# Patient Record
Sex: Male | Born: 1935 | Race: White | Hispanic: No | Marital: Married | Smoking: Former smoker
Health system: Southern US, Community
[De-identification: ages and names within clinical notes are randomized; demographics above are authoritative.]

## PROBLEM LIST (undated history)

## (undated) DIAGNOSIS — Z87898 Personal history of other specified conditions: Secondary | ICD-10-CM

## (undated) DIAGNOSIS — R972 Elevated prostate specific antigen [PSA]: Secondary | ICD-10-CM

## (undated) DIAGNOSIS — Z8781 Personal history of (healed) traumatic fracture: Secondary | ICD-10-CM

## (undated) DIAGNOSIS — M4850XA Collapsed vertebra, not elsewhere classified, site unspecified, initial encounter for fracture: Secondary | ICD-10-CM

## (undated) DIAGNOSIS — K635 Polyp of colon: Secondary | ICD-10-CM

## (undated) DIAGNOSIS — F0390 Unspecified dementia without behavioral disturbance: Secondary | ICD-10-CM

## (undated) DIAGNOSIS — I4891 Unspecified atrial fibrillation: Secondary | ICD-10-CM

## (undated) DIAGNOSIS — I10 Essential (primary) hypertension: Secondary | ICD-10-CM

## (undated) DIAGNOSIS — C44721 Squamous cell carcinoma of skin of unspecified lower limb, including hip: Secondary | ICD-10-CM

## (undated) DIAGNOSIS — H9190 Unspecified hearing loss, unspecified ear: Secondary | ICD-10-CM

## (undated) DIAGNOSIS — Z8673 Personal history of transient ischemic attack (TIA), and cerebral infarction without residual deficits: Secondary | ICD-10-CM

## (undated) HISTORY — DX: Unspecified hearing loss, unspecified ear: H91.90

## (undated) HISTORY — DX: Personal history of other specified conditions: Z87.898

## (undated) HISTORY — PX: TONSILLECTOMY: SHX5217

## (undated) HISTORY — DX: Unspecified atrial fibrillation: I48.91

## (undated) HISTORY — DX: Elevated prostate specific antigen (PSA): R97.20

## (undated) HISTORY — DX: Collapsed vertebra, not elsewhere classified, site unspecified, initial encounter for fracture: M48.50XA

## (undated) HISTORY — DX: Personal history of transient ischemic attack (TIA), and cerebral infarction without residual deficits: Z86.73

## (undated) HISTORY — DX: Squamous cell carcinoma of skin of unspecified lower limb, including hip: C44.721

## (undated) HISTORY — DX: Polyp of colon: K63.5

## (undated) HISTORY — PX: INGUINAL HERNIA REPAIR: SHX194

## (undated) HISTORY — DX: Unspecified dementia, unspecified severity, without behavioral disturbance, psychotic disturbance, mood disturbance, and anxiety: F03.90

## (undated) HISTORY — PX: MIDDLE EAR SURGERY: SHX713

## (undated) HISTORY — DX: Essential (primary) hypertension: I10

## (undated) HISTORY — DX: Personal history of (healed) traumatic fracture: Z87.81

---

## 2000-03-12 DIAGNOSIS — K635 Polyp of colon: Secondary | ICD-10-CM

## 2000-03-12 HISTORY — PX: COLONOSCOPY W/ POLYPECTOMY: SHX1380

## 2000-03-12 HISTORY — DX: Polyp of colon: K63.5

## 2004-03-12 DIAGNOSIS — Z8781 Personal history of (healed) traumatic fracture: Secondary | ICD-10-CM

## 2004-03-12 HISTORY — DX: Personal history of (healed) traumatic fracture: Z87.81

## 2008-03-12 DIAGNOSIS — C44721 Squamous cell carcinoma of skin of unspecified lower limb, including hip: Secondary | ICD-10-CM

## 2008-03-12 HISTORY — DX: Squamous cell carcinoma of skin of unspecified lower limb, including hip: C44.721

## 2010-03-12 DIAGNOSIS — R972 Elevated prostate specific antigen [PSA]: Secondary | ICD-10-CM

## 2010-03-12 HISTORY — DX: Elevated prostate specific antigen (PSA): R97.20

## 2012-01-25 ENCOUNTER — Ambulatory Visit (INDEPENDENT_AMBULATORY_CARE_PROVIDER_SITE_OTHER): Payer: Managed Care, Other (non HMO) | Admitting: Family Medicine

## 2012-01-25 ENCOUNTER — Encounter: Payer: Self-pay | Admitting: Family Medicine

## 2012-01-25 VITALS — BP 151/77 | HR 63 | Temp 98.0°F | Ht 66.0 in | Wt 125.0 lb

## 2012-01-25 DIAGNOSIS — F0390 Unspecified dementia without behavioral disturbance: Secondary | ICD-10-CM

## 2012-01-25 DIAGNOSIS — I10 Essential (primary) hypertension: Secondary | ICD-10-CM

## 2012-01-25 DIAGNOSIS — I499 Cardiac arrhythmia, unspecified: Secondary | ICD-10-CM

## 2012-01-25 DIAGNOSIS — Z7189 Other specified counseling: Secondary | ICD-10-CM

## 2012-01-25 DIAGNOSIS — Z7689 Persons encountering health services in other specified circumstances: Secondary | ICD-10-CM

## 2012-01-25 DIAGNOSIS — Z8673 Personal history of transient ischemic attack (TIA), and cerebral infarction without residual deficits: Secondary | ICD-10-CM

## 2012-01-25 DIAGNOSIS — I4891 Unspecified atrial fibrillation: Secondary | ICD-10-CM | POA: Insufficient documentation

## 2012-01-25 LAB — PROTIME-INR: INR: 2.55 — ABNORMAL HIGH (ref <1.50)

## 2012-01-25 NOTE — Assessment & Plan Note (Signed)
Mild + I'm sure his impaired hearing contributes to his problems with this. Seems to be tolerating aricept.  Will monitor response over time.

## 2012-01-25 NOTE — Assessment & Plan Note (Signed)
BP a bit up here today. Will continue current meds, check BMET today, obtain old records.

## 2012-01-25 NOTE — Assessment & Plan Note (Signed)
HR controlled well on digoxin. Will continue this and coumadin, check PT/INR today. Obtain old records for more details.

## 2012-01-25 NOTE — Assessment & Plan Note (Signed)
Reviewed history, will collect old records.

## 2012-01-25 NOTE — Progress Notes (Signed)
Office Note 01/25/2012  CC:  Chief Complaint  Patient presents with  . Establish Care    no problems    HPI:  Alexander Lyons is a 78 y.o. White male who is here with his son Peyton Najjar to establish care. Patient's most recent primary MD: Dr. Madelaine Etienne in [place], [place].  Patient lives in [facility]. Old records were not reviewed prior to or during today's visit.  He has no acute complaints today.  His son does not know many details of his medical history.  Unknown when last labs were done to check renal function or PT/INR. He and his wife recently moved from their home/farm to [facility] in [place], [place] and he says he is happy with this arrangement. Son describes hx of intermittent dizziness for which he sometimes takes meclizine. Also some intermittent c/o of upset stomach but no details known, and pt denies this today in the room. He is severely hearing impaired and refuses to wear hearing aids.    Past Medical History  Diagnosis Date  . HTN (hypertension)   . Dementia   . History of CVA (cerebrovascular accident)     no residual deficit (this apparently occured shortly after coming off of aspirin for cataract surgery  . Cardiac dysrhythmia   . Hearing impairment in [facility]    Bilateral; [facility] but he won't wear them    Past Surgical History  Procedure Date  . Middle ear surgery     ?external ear surgery, too.      No family history on file. Noncontributory  History   Social History  . Marital Status: Married    Spouse Name: N/A    Number of Children: N/A  . Years of Education: N/A   Occupational History  . Not on file.   Social History Main Topics  . Smoking status: Former Smoker    Types: Cigarettes    Quit date: 03/12/1968  . Smokeless tobacco: Never Used  . Alcohol Use: No  . Drug Use: No  . Sexually Active: Not on file   Other Topics Concern  . 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  No alcohol or drugs.Currently lives at [facility].  He was in the [employer] in [employer].    Outpatient Encounter Prescriptions as of 01/25/2012  Medication Sig Dispense Refill  . digoxin (LANOXIN) 0.125 MG tablet Take 1 tablet by mouth Daily.      Marland Kitchen donepezil (ARICEPT) 10 MG tablet Take 0.5 tablets by mouth At bedtime.      . hydrALAZINE (APRESOLINE) 25 MG tablet Take 1 tablet by mouth Twice daily as needed. For dizziness      . lisinopril-hydrochlorothiazide (PRINZIDE,ZESTORETIC) 20-25 MG per tablet Take 0.5 tablets by mouth Once daily.      Marland Kitchen warfarin (COUMADIN) 2.5 MG tablet Take 0.5 tablets by mouth At bedtime.        No Known Allergies  ROS Review of Systems  Constitutional: Negative for fever and fatigue.  HENT: Negative for congestion and sore throat.   Eyes: Negative for visual disturbance.  Respiratory: Negative for cough.   Cardiovascular: Negative for chest pain.  Gastrointestinal: Negative for nausea and abdominal pain.  Genitourinary: Negative for dysuria.  Musculoskeletal: Negative for back pain and joint swelling.  Skin: Negative for rash.  Neurological: Negative for weakness and headaches.  Hematological: Negative for adenopathy.  Psychiatric/Behavioral: Negative for dysphoric  mood.    PE; Blood pressure 151/77, pulse 63, temperature 98 F (36.7 C), temperature source Temporal, height 5\' 6"  (1.676 m), weight 125 lb (56.7 kg), SpO2 99.00%. Gen: Alert, well appearing, thin, elderly gentleman in NAD.  Patient is oriented to person, place, time, and situation. AFFECT: pleasant, lucid thought and speech. ENT: Ears: EACs clear, normal epithelium.  TMs with good light reflex and landmarks bilaterally.  Eyes: no injection, icteris, swelling, or exudate.  EOMI, PERRLA. Nose: no drainage or turbinate edema/swelling.  No injection or focal lesion.  Mouth: lips without lesion/swelling.  Oral mucosa pink and moist.  Dentition intact and  without obvious caries or gingival swelling.  Oropharynx without erythema, exudate, or swelling.  Neck - No masses or thyromegaly or limitation in range of motion CV: irregularly irregular rhythm, rate in the 60s-70.  No murmur, rub, or gallop. LUNGS: CTA bilat, nonlabored resps. ABD: soft, NT, ND, BS normal.  No hepatospenomegaly or mass.  No bruits. EXT: no clubbing, cyanosis, or edema.  MUSC: kyphotic spinal deformity  Pertinent labs:  None today  ASSESSMENT AND PLAN:   Encounter to establish care with new doctor Reviewed history, will collect old records.  Dementia Mild + I'm sure his impaired hearing contributes to his problems with this. Seems to be tolerating aricept.  Will monitor response over time.  HTN (hypertension) BP a bit up here today. Will continue current meds, check BMET today, obtain old records.  Cardiac dysrhythmia HR controlled well on digoxin. Will continue this and coumadin, check PT/INR today. Obtain old records for more details.   An After Visit Summary was printed and given to the patient.  Return in about 3 months (around 04/26/2012) for Office visit f/u HTN, dysrhythmia.  Lab visit in 1 mo for PT/INR.

## 2012-01-26 LAB — BASIC METABOLIC PANEL
CO2: 31 mEq/L (ref 19–32)
Chloride: 97 mEq/L (ref 96–112)
Creat: 1.28 mg/dL (ref 0.50–1.35)
Potassium: 4.3 mEq/L (ref 3.5–5.3)
Sodium: 137 mEq/L (ref 135–145)

## 2012-01-29 ENCOUNTER — Telehealth: Payer: Self-pay | Admitting: *Deleted

## 2012-01-29 NOTE — Telephone Encounter (Signed)
Coumadin dose corrected in med list per son, Peyton Najjar.

## 2012-02-22 ENCOUNTER — Other Ambulatory Visit (INDEPENDENT_AMBULATORY_CARE_PROVIDER_SITE_OTHER): Payer: Medicare Other

## 2012-02-22 DIAGNOSIS — I499 Cardiac arrhythmia, unspecified: Secondary | ICD-10-CM

## 2012-02-22 LAB — PROTIME-INR
INR: 2.2 ratio — ABNORMAL HIGH (ref 0.8–1.0)
Prothrombin Time: 22.9 s — ABNORMAL HIGH (ref 10.2–12.4)

## 2012-04-03 ENCOUNTER — Other Ambulatory Visit (INDEPENDENT_AMBULATORY_CARE_PROVIDER_SITE_OTHER): Payer: Managed Care, Other (non HMO)

## 2012-04-03 DIAGNOSIS — I499 Cardiac arrhythmia, unspecified: Secondary | ICD-10-CM

## 2012-04-03 LAB — PROTIME-INR
INR: 3.4 ratio — ABNORMAL HIGH (ref 0.8–1.0)
Prothrombin Time: 35.4 s — ABNORMAL HIGH (ref 10.2–12.4)

## 2012-04-03 NOTE — Progress Notes (Signed)
Labs only

## 2012-04-18 ENCOUNTER — Other Ambulatory Visit: Payer: Managed Care, Other (non HMO)

## 2012-04-18 ENCOUNTER — Other Ambulatory Visit (INDEPENDENT_AMBULATORY_CARE_PROVIDER_SITE_OTHER): Payer: Managed Care, Other (non HMO)

## 2012-04-18 DIAGNOSIS — I499 Cardiac arrhythmia, unspecified: Secondary | ICD-10-CM

## 2012-04-18 NOTE — Progress Notes (Signed)
Labs only

## 2012-04-25 ENCOUNTER — Ambulatory Visit: Payer: Managed Care, Other (non HMO) | Admitting: Family Medicine

## 2012-04-28 ENCOUNTER — Ambulatory Visit: Payer: Managed Care, Other (non HMO) | Admitting: Family Medicine

## 2012-05-02 ENCOUNTER — Ambulatory Visit (INDEPENDENT_AMBULATORY_CARE_PROVIDER_SITE_OTHER): Payer: Medicare Other | Admitting: Family Medicine

## 2012-05-02 ENCOUNTER — Encounter: Payer: Self-pay | Admitting: Family Medicine

## 2012-05-02 VITALS — BP 124/68 | HR 80 | Temp 97.2°F | Ht 66.0 in | Wt 123.0 lb

## 2012-05-02 DIAGNOSIS — R630 Anorexia: Secondary | ICD-10-CM

## 2012-05-02 DIAGNOSIS — I4891 Unspecified atrial fibrillation: Secondary | ICD-10-CM

## 2012-05-02 DIAGNOSIS — R350 Frequency of micturition: Secondary | ICD-10-CM

## 2012-05-02 LAB — CBC WITH DIFFERENTIAL/PLATELET
Basophils Relative: 0.3 % (ref 0.0–3.0)
Eosinophils Absolute: 0.1 10*3/uL (ref 0.0–0.7)
Eosinophils Relative: 0.6 % (ref 0.0–5.0)
HCT: 38.7 % — ABNORMAL LOW (ref 39.0–52.0)
Hemoglobin: 12.8 g/dL — ABNORMAL LOW (ref 13.0–17.0)
MCHC: 33.1 g/dL (ref 30.0–36.0)
MCV: 87 fl (ref 78.0–100.0)
Monocytes Absolute: 1.1 10*3/uL — ABNORMAL HIGH (ref 0.1–1.0)
Neutro Abs: 7.6 10*3/uL (ref 1.4–7.7)
RBC: 4.45 Mil/uL (ref 4.22–5.81)
WBC: 10.4 10*3/uL (ref 4.5–10.5)

## 2012-05-02 LAB — POCT URINALYSIS DIPSTICK
Blood, UA: NEGATIVE
Protein, UA: NEGATIVE
Spec Grav, UA: 1.025
pH, UA: 5.5

## 2012-05-02 LAB — COMPREHENSIVE METABOLIC PANEL
Albumin: 3.5 g/dL (ref 3.5–5.2)
Alkaline Phosphatase: 56 U/L (ref 39–117)
BUN: 19 mg/dL (ref 6–23)
CO2: 29 mEq/L (ref 19–32)
GFR: 58.87 mL/min — ABNORMAL LOW (ref >60.00)
Glucose, Bld: 99 mg/dL (ref 70–99)
Potassium: 3.8 mEq/L (ref 3.5–5.1)
Total Bilirubin: 0.8 mg/dL (ref 0.3–1.2)

## 2012-05-02 LAB — PSA: PSA: 136.31 ng/mL — ABNORMAL HIGH (ref 0.10–4.00)

## 2012-05-02 NOTE — Progress Notes (Signed)
OFFICE NOTE  05/02/2012  CC:  Chief Complaint  Patient presents with  . Follow-up    HTN, dysrhythmia     HPI: Patient is a 79 y.o. Caucasian male who is here for 3 mo f/u dementia, a-fib w/chronic coumadin therapy, and HTN. Son with him today and reports that pt's wife says pt has been c/o decreased energy, not eating well lately, increase in his chronic urinary frequency.   The patient himself denies any complaints today, specifically hematuria, dysuria, abd pain, or GU/anal pain.  No change in stool habits, no blood in stool.    Pertinent PMH:  Past Medical History  Diagnosis Date  . HTN (hypertension)   . Dementia   . History of CVA (cerebrovascular accident)     no residual deficit (this apparently occured shortly after coming off of aspirin for cataract surgery.  Of note, old records show MRI head w/out contrast 07/2010 showed chronic small vessel infarctions w/in the pons + chronic small vessel dz throughout the brain.  . Atrial fibrillation   . Hearing impairment in [facility] 2    Bilateral; [facility] but he won't wear them  . Colon polyp 2002  . History of dizziness   . Elevated PSA 2012    PSA 28.13 Apr 2010: Urology eval (Dr. Cleone Slim)  . SCC (squamous cell carcinoma), leg 2010    left  . History of rib fracture 2006    4 on right; nondisplaced (s/p lawn mower fell on him)  . Vertebral compression fracture     30% anterior wedge compression deformity T11   Past surgical, social, and family history reviewed and no changes noted since last office visit.  MEDS:  Outpatient Prescriptions Prior to Visit  Medication Sig Dispense Refill  . digoxin (LANOXIN) 0.125 MG tablet Take 1 tablet by mouth Daily.      Marland Kitchen donepezil (ARICEPT) 10 MG tablet Take 0.5 tablets by mouth At bedtime.      . hydrALAZINE (APRESOLINE) 25 MG tablet Take 1 tablet by mouth Twice daily as needed. For dizziness      . lisinopril-hydrochlorothiazide (PRINZIDE,ZESTORETIC) 20-25 MG per tablet Take 0.5  tablets by mouth Once daily.      Marland Kitchen warfarin (COUMADIN) 2.5 MG tablet Take 1/2 tab M, W, F, S, Su and 1 tab on Tues, Thurs       No facility-administered medications prior to visit.    PE: Blood pressure 124/68, pulse 80, temperature 97.2 F (36.2 C), temperature source Temporal, height 5\' 6"  (1.676 m), weight 123 lb (55.792 kg). Gen: Alert, well appearing.  Patient is oriented to person, place, time, and situation. Very much hearing impaired. ENT:   Eyes: no injection, icteris, swelling, or exudate.  EOMI, PERRLA. Nose: no drainage or turbinate edema/swelling.  No injection or focal lesion.  Mouth: lips without lesion/swelling.  Oral mucosa pink and moist. Oropharynx without erythema, exudate, or swelling.  CV: Irreg irreg rhythm, rate 80s, no m/r/g LUNGS: CTA bilat, nonlabored resps EXT: no clubbing, cyanosis, or edema.   CC UA today showed trace ketones, o/w normal.  IMPRESSION AND PLAN:  Atrial fibrillation Stable.  Rate controlled with dig, anticoagulated with coumadin. Due for PT/INR today.    HTN (hypertension) Problem stable.  Continue current medications and diet appropriate for this condition.  We have reviewed our general long term plan for this problem and also reviewed symptoms and signs that should prompt the patient to call or return to the office.  Dementia Stable. Continue aricept.  Urinary frequency With mild nonspecific malaise and diminished appetite per wife's report---no sign of urinary infection on UA today, but will send for c/s. He could have prostatitis, but will hold off on antibiotics until all blood tests come back (cbc, cmet, PT/INR, PSA, TSH). If all come back ok, will start empiric abx for prostatitis and see how he does, follow INR closely since the abx will potentially alter coumadin metabilism.  Elevated PSA Hx of elevated PSA and there is hint of a further w/u either by a urologist or hem/onc MD in the past but pt's son doesn't really have  any history regarding this except " I think they wanted to do a biopsy but my brother declined to let them do this b/c he felt like they would not pursue treatment if they found cancer".  Will try to get old records (Dr. Cleone Slim is mentioned in old records).   An After Visit Summary was printed and given to the patient.  FOLLOW UP: 49mo

## 2012-05-02 NOTE — Assessment & Plan Note (Signed)
With mild nonspecific malaise and diminished appetite per wife's report---no sign of urinary infection on UA today, but will send for c/s. He could have prostatitis, but will hold off on antibiotics until all blood tests come back (cbc, cmet, PT/INR, PSA, TSH). If all come back ok, will start empiric abx for prostatitis and see how he does, follow INR closely since the abx will potentially alter coumadin metabilism.

## 2012-05-02 NOTE — Assessment & Plan Note (Signed)
Problem stable.  Continue current medications and diet appropriate for this condition.  We have reviewed our general long term plan for this problem and also reviewed symptoms and signs that should prompt the patient to call or return to the office.  

## 2012-05-02 NOTE — Assessment & Plan Note (Signed)
Hx of elevated PSA and there is hint of a further w/u either by a urologist or hem/onc MD in the past but pt's son doesn't really have any history regarding this except " I think they wanted to do a biopsy but my brother declined to let them do this b/c he felt like they would not pursue treatment if they found cancer".  Will try to get old records (Dr. Cleone Slim is mentioned in old records).

## 2012-05-02 NOTE — Assessment & Plan Note (Signed)
Stable.  Rate controlled with dig, anticoagulated with coumadin. Due for PT/INR today.

## 2012-05-02 NOTE — Assessment & Plan Note (Signed)
Stable Continue aricept 

## 2012-05-05 ENCOUNTER — Other Ambulatory Visit: Payer: Self-pay | Admitting: Family Medicine

## 2012-05-05 MED ORDER — SULFAMETHOXAZOLE-TRIMETHOPRIM 800-160 MG PO TABS: 1.0000 | ORAL_TABLET | Freq: Two times a day (BID) | ORAL | Status: AC

## 2012-05-14 ENCOUNTER — Telehealth: Payer: Self-pay | Admitting: Family Medicine

## 2012-05-14 ENCOUNTER — Other Ambulatory Visit (INDEPENDENT_AMBULATORY_CARE_PROVIDER_SITE_OTHER): Payer: Medicare Other

## 2012-05-14 DIAGNOSIS — I4891 Unspecified atrial fibrillation: Secondary | ICD-10-CM

## 2012-05-14 LAB — PROTIME-INR: Prothrombin Time: 74.9 s (ref 10.2–12.4)

## 2012-05-14 NOTE — Telephone Encounter (Signed)
Caller: Alexander Lyons; PCP: [name]); CB#: [phone]; Call regarding Alexander Lyons is calling from [facility] regarding a PT/INR ordered on Alexander Lyons, Alexander Lyons by Earley Favor [facility]).  Same is Critical at 74.9 and 7.357.  Prior labs were drawn on 04/18/12 26.4 and 2.5.  Per standing orders, contacted the home.  Spoke with Alexander Lyons and she denies any bleeding or problems.  States that he would not be able to hear me or talk to me.  He is on Coumadin 2.5mg  tabs taking 1/2 tab everyday except for Monday and Thursday and he takes a whole tablet.  He was started on an antibiotic last week for a prostate infection: Trimetho/sulfamethox 160/800mg  1 po bid.   She will hold the Coumadin for tonight.  I am calling MD on call for further instructions.  He advised to send a note to [place], Charity fundraiser and if patient were to start bleeding that he will need to go to the ED.  I instruceted Alexander Lyons on this.  She will have to call 911 if he develops any bleeding and agreed to do so.  I assured her that Arline Asp would be calling her in the am.

## 2012-05-14 NOTE — Progress Notes (Signed)
Labs only

## 2012-05-15 ENCOUNTER — Telehealth: Payer: Self-pay | Admitting: *Deleted

## 2012-05-15 ENCOUNTER — Ambulatory Visit (INDEPENDENT_AMBULATORY_CARE_PROVIDER_SITE_OTHER): Payer: Medicare Other | Admitting: General Practice

## 2012-05-15 ENCOUNTER — Other Ambulatory Visit: Payer: Medicare Other

## 2012-05-15 ENCOUNTER — Telehealth: Payer: Self-pay | Admitting: General Practice

## 2012-05-15 DIAGNOSIS — R630 Anorexia: Secondary | ICD-10-CM

## 2012-05-15 DIAGNOSIS — I4891 Unspecified atrial fibrillation: Secondary | ICD-10-CM

## 2012-05-15 DIAGNOSIS — Z7901 Long term (current) use of anticoagulants: Secondary | ICD-10-CM

## 2012-05-15 DIAGNOSIS — F0390 Unspecified dementia without behavioral disturbance: Secondary | ICD-10-CM

## 2012-05-15 NOTE — Telephone Encounter (Signed)
OK, referral ordered 

## 2012-05-15 NOTE — Telephone Encounter (Signed)
Instructed pt  to stop coumadin and have pt/inr checked in lab at [facility] on Monday 3/10.  Spoke with Judeth Cornfield, Dr. Samul Dada CMA and asked her to call patient's son to schedule lab appointment.

## 2012-05-15 NOTE — Telephone Encounter (Signed)
Noted  

## 2012-05-15 NOTE — Telephone Encounter (Signed)
Staff message sent to [facility] to notify referral entered.

## 2012-05-15 NOTE — Patient Instructions (Addendum)
Opened in error

## 2012-05-15 NOTE — Telephone Encounter (Signed)
I will call Alexander Lyons.  We are in the process of setting up home health for [name] to have nursing services and PT/INR drawn at home. Will document follow up in result note.

## 2012-05-15 NOTE — Telephone Encounter (Signed)
Alexander Lyons is agreeable to home health nursing services to eval [name] and do PT/INR.  I spoke to [place] at [facility].  Pt will qualify with homebound status and diagnosis of dementia and decrease appetite. Order should state: "RN to assess all needs and add other disciplines as needed"; also PT/INR as directed to be called to physician I will send a staff message to [facility] when referral has been entered.

## 2012-05-20 ENCOUNTER — Telehealth: Payer: Self-pay | Admitting: *Deleted

## 2012-05-20 LAB — PROTIME-INR: Protime: 21.1 seconds

## 2012-05-20 NOTE — Telephone Encounter (Signed)
PC from [facility] RN PT=21.1 INR=1.8 Pt continues to hold coumadin.  Please advise.

## 2012-05-20 NOTE — Telephone Encounter (Signed)
Restart coumadin:  2.5mg  tab, 1/2 on M/T/Th/Sat/Sun, and 1 whole 2.5mg  tab on wed and Friday. Recheck PT/INR in 10d.  -thx

## 2012-05-20 NOTE — Telephone Encounter (Signed)
Alexander Lyons notified of instructions.  15 minutes spent discussing coumadin, dosing schedule, antibiotics and home health needs.  Alexander Lyons will give his mother instructions for coumadin.  [name] notified to recheck INR in ten days. Alexander Lyons also wanted to know if there is any follow up after finishing antibiotics.  Pt has two pills left.  Please advise.

## 2012-05-22 ENCOUNTER — Encounter (HOSPITAL_COMMUNITY): Payer: Self-pay

## 2012-05-22 ENCOUNTER — Telehealth: Payer: Self-pay | Admitting: *Deleted

## 2012-05-22 ENCOUNTER — Inpatient Hospital Stay (HOSPITAL_COMMUNITY)
Admission: EM | Admit: 2012-05-22 | Discharge: 2012-05-26 | DRG: 683 | Disposition: A | Discharge status: Skilled Nursing Facility | Payer: Medicare Other | Attending: Internal Medicine | Admitting: Internal Medicine

## 2012-05-22 DIAGNOSIS — IMO0002 Reserved for concepts with insufficient information to code with codable children: Secondary | ICD-10-CM

## 2012-05-22 DIAGNOSIS — I4891 Unspecified atrial fibrillation: Secondary | ICD-10-CM | POA: Diagnosis present

## 2012-05-22 DIAGNOSIS — Z7901 Long term (current) use of anticoagulants: Secondary | ICD-10-CM

## 2012-05-22 DIAGNOSIS — Z79899 Other long term (current) drug therapy: Secondary | ICD-10-CM

## 2012-05-22 DIAGNOSIS — N179 Acute kidney failure, unspecified: Principal | ICD-10-CM | POA: Diagnosis present

## 2012-05-22 DIAGNOSIS — R972 Elevated prostate specific antigen [PSA]: Secondary | ICD-10-CM | POA: Diagnosis present

## 2012-05-22 DIAGNOSIS — R627 Adult failure to thrive: Secondary | ICD-10-CM | POA: Diagnosis present

## 2012-05-22 DIAGNOSIS — I129 Hypertensive chronic kidney disease with stage 1 through stage 4 chronic kidney disease, or unspecified chronic kidney disease: Secondary | ICD-10-CM | POA: Diagnosis present

## 2012-05-22 DIAGNOSIS — E871 Hypo-osmolality and hyponatremia: Secondary | ICD-10-CM | POA: Diagnosis present

## 2012-05-22 DIAGNOSIS — Z860100 Personal history of colon polyps, unspecified: Secondary | ICD-10-CM

## 2012-05-22 DIAGNOSIS — F0390 Unspecified dementia without behavioral disturbance: Secondary | ICD-10-CM | POA: Diagnosis present

## 2012-05-22 DIAGNOSIS — N182 Chronic kidney disease, stage 2 (mild): Secondary | ICD-10-CM | POA: Diagnosis present

## 2012-05-22 DIAGNOSIS — Z87891 Personal history of nicotine dependence: Secondary | ICD-10-CM

## 2012-05-22 DIAGNOSIS — I959 Hypotension, unspecified: Secondary | ICD-10-CM | POA: Diagnosis present

## 2012-05-22 DIAGNOSIS — Z8673 Personal history of transient ischemic attack (TIA), and cerebral infarction without residual deficits: Secondary | ICD-10-CM

## 2012-05-22 LAB — BASIC METABOLIC PANEL
BUN: 50 mg/dL — ABNORMAL HIGH (ref 6–23)
Calcium: 9 mg/dL (ref 8.4–10.5)
Chloride: 91 mEq/L — ABNORMAL LOW (ref 96–112)
Creatinine, Ser: 2.93 mg/dL — ABNORMAL HIGH (ref 0.50–1.35)
GFR calc Af Amer: 20 mL/min — ABNORMAL LOW (ref >90)
GFR calc non Af Amer: 17 mL/min — ABNORMAL LOW (ref >90)

## 2012-05-22 LAB — URINALYSIS, ROUTINE W REFLEX MICROSCOPIC
Hgb urine dipstick: NEGATIVE
Ketones, ur: NEGATIVE mg/dL
Nitrite: NEGATIVE
Protein, ur: NEGATIVE mg/dL
Specific Gravity, Urine: 1.02 (ref 1.005–1.030)
Urobilinogen, UA: 1 mg/dL (ref 0.0–1.0)

## 2012-05-22 LAB — CBC WITH DIFFERENTIAL/PLATELET
Basophils Absolute: 0 10*3/uL (ref 0.0–0.1)
Eosinophils Absolute: 0.5 10*3/uL (ref 0.0–0.7)
Eosinophils Relative: 6 % — ABNORMAL HIGH (ref 0–5)
Lymphs Abs: 1.4 10*3/uL (ref 0.7–4.0)
MCH: 29.7 pg (ref 26.0–34.0)
MCV: 81.1 fL (ref 78.0–100.0)
Neutro Abs: 6.4 10*3/uL (ref 1.7–7.7)
Neutrophils Relative %: 63 % (ref 43–77)
Platelets: 366 10*3/uL (ref 150–400)
RBC: 4.91 MIL/uL (ref 4.22–5.81)
RDW: 14 % (ref 11.5–15.5)
WBC: 9 10*3/uL (ref 4.0–10.5)

## 2012-05-22 LAB — TSH: TSH: 2.654 u[IU]/mL (ref 0.350–4.500)

## 2012-05-22 LAB — PROTIME-INR
INR: 3.23 — ABNORMAL HIGH (ref 0.00–1.49)
INR: 3.4 — ABNORMAL HIGH (ref 0.00–1.49)

## 2012-05-22 MED ORDER — ONDANSETRON HCL 4 MG/2ML IJ SOLN: 4.0000 mg | Freq: Four times a day (QID) | INTRAMUSCULAR | Status: DC | PRN

## 2012-05-22 MED ORDER — SODIUM CHLORIDE 0.9 % IV SOLN
INTRAVENOUS | Status: DC
  Administered 2012-05-23 – 2012-05-25 (×2): via INTRAVENOUS
  Administered 2012-05-25: 10 mL/h via INTRAVENOUS

## 2012-05-22 MED ORDER — DIGOXIN 125 MCG PO TABS
0.1250 mg | ORAL_TABLET | Freq: Every day | ORAL | Status: DC
  Administered 2012-05-23: 0.125 mg via ORAL
  Filled 2012-05-22: qty 1

## 2012-05-22 MED ORDER — ONDANSETRON HCL 4 MG PO TABS: 4.0000 mg | ORAL_TABLET | Freq: Four times a day (QID) | ORAL | Status: DC | PRN

## 2012-05-22 MED ORDER — ALBUTEROL SULFATE (5 MG/ML) 0.5% IN NEBU: 2.5000 mg | INHALATION_SOLUTION | RESPIRATORY_TRACT | Status: DC | PRN

## 2012-05-22 MED ORDER — SODIUM CHLORIDE 0.9 % IV SOLN
INTRAVENOUS | Status: AC
  Administered 2012-05-22: 21:00:00 via INTRAVENOUS

## 2012-05-22 MED ORDER — DONEPEZIL HCL 5 MG PO TABS
5.0000 mg | ORAL_TABLET | Freq: Every day | ORAL | Status: DC
  Administered 2012-05-23 – 2012-05-25 (×3): 5 mg via ORAL
  Filled 2012-05-22 (×5): qty 1

## 2012-05-22 MED ORDER — ACETAMINOPHEN 650 MG RE SUPP: 650.0000 mg | Freq: Four times a day (QID) | RECTAL | Status: DC | PRN

## 2012-05-22 MED ORDER — ACETAMINOPHEN 325 MG PO TABS: 650.0000 mg | ORAL_TABLET | Freq: Four times a day (QID) | ORAL | Status: DC | PRN

## 2012-05-22 MED ORDER — SODIUM CHLORIDE 0.9 % IJ SOLN
3.0000 mL | Freq: Two times a day (BID) | INTRAMUSCULAR | Status: DC
  Administered 2012-05-24 – 2012-05-26 (×3): 3 mL via INTRAVENOUS

## 2012-05-22 MED ORDER — SODIUM CHLORIDE 0.9 % IV BOLUS (SEPSIS)
500.0000 mL | Freq: Once | INTRAVENOUS | Status: AC
  Administered 2012-05-22: 500 mL via INTRAVENOUS

## 2012-05-22 NOTE — Telephone Encounter (Signed)
Agree. Will call Mr. Haddon son later to discuss situation.

## 2012-05-22 NOTE — H&P (Signed)
[facility] History and Physical  [name]:[id number] DOB: 12/14/1916 DOA: 05/22/2012  Referring physician: EDP PCP: Jeoffrey Massed, MD  Specialists:  None  Chief Complaint: hypotension, generalized weakness.  HPI: Alexander Lyons is a 79 y.o. male with past medical history of hypertension, dementia, CVA, atrial fibrillation on Coumadin presents to ED on 05/22/12 with complaints of poor oral intake, generalized weakness and hypotension. Patient is unable to provide any history secondary to his dementia. History is obtained from patient's son Mr. Jahmir Salo who is at the bedside. Patient apparently is a generally poor eater. 2 weeks ago he was treated for possible prostatitis with oral antibiotics. Since then, apparently his appetite has gotten even worse. He barely eats and drinks water only with his medications. He has progressively become weak and has mostly been bedbound. No history of fever, chills, headache, earache, sore throat, cough, dyspnea, chest pain, vomiting, diarrhea, abdominal pain, urinary frequency or dysuria. Patient does feel cold at times and has occasional nausea. His INR shot up to greater than 7 approximately a week ago. When home health RN went to evaluate him, blood pressure was 88/50. He also had decreased urine output. His condition was discussed with his PCP who advised him to come to the ED for further evaluation. In the ED, initial blood pressure was 80/50 which improved after IV fluid bolus. Sodium 128 and creatinine 2.93 with baseline in the 1.2 range. Hospitalist service requested to admit for further evaluation and management.   Review of Systems: All systems reviewed and apart from history of presenting illness, are negative. Patient does have memory deficits from his dementia.   Past Medical History  Diagnosis Date  . HTN (hypertension)   . Dementia   . History of CVA (cerebrovascular accident)     no residual deficit (this apparently  occured shortly after coming off of aspirin for cataract surgery.  Of note, old records show MRI head w/out contrast 07/2010 showed chronic small vessel infarctions w/in the pons + chronic small vessel dz throughout the brain.  . Atrial fibrillation   . Hearing impairment in [facility] 2    Bilateral; [facility] but he won't wear them  . Colon polyp 2002  . History of dizziness   . Elevated PSA 2012    PSA 28.13 Apr 2010: Urology eval (Dr. Cleone Slim)  . SCC (squamous cell carcinoma), leg 2010    left  . History of rib fracture 2006    4 on right; nondisplaced (s/p lawn mower fell on him)  . Vertebral compression fracture     30% anterior wedge compression deformity T11   Past Surgical History  Procedure Laterality Date  . Middle ear surgery      Cholesteatoma-?external ear surgery, too.    . Inguinal hernia repair      right  . Colonoscopy w/ polypectomy  2002  . Tonsillectomy     Social History:  reports that he quit smoking about 44 years ago. His smoking use included Cigarettes. He smoked 0.00 packs per day. He has never used smokeless tobacco. He reports that he does not drink alcohol or use illicit drugs.  patient is married and lives with his wife. Usually he is independent of activities of daily living but for the last couple of weeks has been mostly bedbound.  No Known Allergies  History reviewed. No pertinent family history. reviewed with son and said to be negative.  Prior to Admission medications   Medication Sig  Start Date End Date Taking? Authorizing Provider  digoxin (LANOXIN) 0.125 MG tablet Take 0.125 mg by mouth daily.  01/22/12  Yes Historical Provider, MD  donepezil (ARICEPT) 10 MG tablet Take 5 mg by mouth at bedtime.  01/23/12  Yes Historical Provider, MD  hydrALAZINE (APRESOLINE) 25 MG tablet Take 1 tablet by mouth 2 (two) times daily as needed. For dizziness 01/22/12  Yes Historical Provider, MD  lisinopril-hydrochlorothiazide (PRINZIDE,ZESTORETIC) 20-25 MG per  tablet Take 0.5 tablets by mouth daily.  01/22/12  Yes Historical Provider, MD  warfarin (COUMADIN) 2.5 MG tablet Take 1.25-2.5 mg by mouth daily. Half tablet (1.25mg ) Monday, Tuesday, Thursday, Saturday, Sunday; 1 tablet (2.5mg ) Wednesday, Friday 01/22/12  Yes Historical Provider, MD   Physical Exam: Filed Vitals:   05/22/12 1930 05/22/12 2000 05/22/12 2015 05/22/12 2045  BP: 120/63 121/72  114/56  Pulse: 65 92 88 61  Temp:      TempSrc:      Resp: 12 12 23 17   SpO2: 100% 98% 99% 99%     General exam: Moderately built and thinly nourished male patient, lying comfortably supine on the gurney in no obvious distress.  Head, eyes and ENT: Nontraumatic and normocephalic. Pupils equally reacting to light and accommodation. Oral mucosa dry and tongue is coated white.  Neck: Supple. No JVD, carotid bruit or thyromegaly.  Lymphatics: No lymphadenopathy.  Respiratory system: Clear to auscultation. No increased work of breathing.  Cardiovascular system: S1 and S2 heard, irregularly irregular. No JVD, murmurs, gallops, clicks or pedal edema. Telemetry shows A. fib with controlled ventricular rate.  Gastrointestinal system: Abdomen is nondistended, soft and nontender. Normal bowel sounds heard. No organomegaly or masses appreciated.  Central nervous system: Alert and oriented only to self. No focal neurological deficits.  Extremities: Symmetric 5 x 5 power. Peripheral pulses symmetrically felt. Patchy redness bilateral legs anteriorly without pruritus or any other acute findings  Skin: No rashes or acute findings. Skin is dry.  Musculoskeletal system: Negative exam.  Psychiatry: Pleasant and cooperative.   Labs on Admission:  Basic Metabolic Panel:  Recent Labs Lab 05/22/12 1839  NA 128*  K 4.3  CL 91*  CO2 25  GLUCOSE 107*  BUN 50*  CREATININE 2.93*  CALCIUM 9.0   Liver Function Tests: No results found for this basename: AST, ALT, ALKPHOS, BILITOT, PROT, ALBUMIN,  in the  last 168 hours No results found for this basename: LIPASE, AMYLASE,  in the last 168 hours No results found for this basename: AMMONIA,  in the last 168 hours CBC:  Recent Labs Lab 05/22/12 1839  WBC 9.0  NEUTROABS 6.4  HGB 14.6  HCT 39.8  MCV 81.1  PLT 366   Cardiac Enzymes: No results found for this basename: CKTOTAL, CKMB, CKMBINDEX, TROPONINI,  in the last 168 hours  BNP (last 3 results) No results found for this basename: PROBNP,  in the last 8760 hours CBG: No results found for this basename: GLUCAP,  in the last 168 hours  Radiological Exams on Admission: No results found.  EKG: Independently reviewed. Atrial fibrillation with controlled ventricular rate, normal axis, ST depression and T-wave inversion in inferior and lateral leads and possible LVH.  Assessment/Plan Principal Problem:   Renal failure (ARF), acute on chronic Active Problems:   HTN (hypertension)   Dementia   Elevated PSA   Hypotension   Dehydration with hyponatremia   A-fib on Coumadin   1. Acute on stage II chronic kidney disease: Secondary to dehydration, hypotension/? ATN and ACE  inhibitors. Admit to telemetry. Hold ACE inhibitors. Check renal ultrasound. Hydrate with IV fluids and follow daily BMP. 2. Hyponatremic dehydration: Secondary to poor oral intake and diuretics. IV fluids and monitor. 3. Hypotension: Secondary to dehydration and antihypertensives. Improved after IV fluid bolus. Hold antihypertensives and continue IV fluids and monitor. 4. Atrial fibrillation on chronic Coumadin: Controlled ventricular rate. Recently coagulopathic secondary to poor oral intake and antibiotics. INR has improved but still slightly higher than intended goal. Digoxin level is therapeutic. Continue digoxin and Coumadin per pharmacy. 5. Dementia, moderate: No agitation. Fall precautions and bed alarms. 6. Failure to thrive: Secondary to acute medical illness complicating underlying dementia. Physical therapy  evaluation. 7. Elevated PSA: Outpatient followup with PCP on discharge.     Code Status: Full  Family Communication: Discussed with patient's son Mr. Destry Dauber at bedside.  Disposition Plan: Home when medically stable.   Time spent: 65 minutes  [facility] Pager [phone]  If 7PM-7AM, please contact night-coverage [email or web address] Password [email or web address] 05/22/2012, 9:22 PM

## 2012-05-22 NOTE — Telephone Encounter (Signed)
PC from [place], Charity fundraiser at [facility].  She is at the patient's home now. BP 88/50, P 95 Temp=97.3 Skin color poor, denies pain, alert, not oriented, lungs clear, still in a fib.  Pt has had 8 oz of water today, has urinated once today.  Pt took AM meds.  Verbal from Dr. Milinda Cave, pt needs to go to ED for fluids.  Thayer Ohm notified and she will relay to family.

## 2012-05-22 NOTE — Progress Notes (Signed)
ANTICOAGULATION CONSULT NOTE - Initial Consult  Pharmacy Consult for Coumadin Indication: atrial fibrillation  No Known Allergies  Labs:  Recent Labs  05/20/12 1200 05/22/12 1839  HGB  --  14.6  HCT  --  39.8  PLT  --  366  LABPROT  --  31.2*  INR 1.8* 3.23*  CREATININE  --  2.93*    The CrCl is unknown because both a height and weight (above a minimum accepted value) are required for this calculation.   Medical History: Past Medical History  Diagnosis Date  . HTN (hypertension)   . Dementia   . History of CVA (cerebrovascular accident)     no residual deficit (this apparently occured shortly after coming off of aspirin for cataract surgery.  Of note, old records show MRI head w/out contrast 07/2010 showed chronic small vessel infarctions w/in the pons + chronic small vessel dz throughout the brain.  . Atrial fibrillation   . [facility] in [facility]    Bilateral; [facility] but [facility] won't wear them  . Colon polyp 2002  . History of dizziness   . Elevated PSA 2012    PSA 28.13 Apr 2010: Urology eval (Dr. Cleone Slim)  . SCC (squamous cell carcinoma), leg 2010    left  . History of rib fracture 2006    4 on right; nondisplaced (s/p lawn mower fell on him)  . Vertebral compression fracture     30% anterior wedge compression deformity T11    Assessment: 79 year old male on Coumadin PTA for Afib.  Admitted with dehydration and not eating.  Admit INR is slightly elevated at 3.23  Dose PTA = 1.25 mg MTThSat, 2.5 mg WF  Goal of Therapy:  INR 2-3 Monitor platelets by anticoagulation protocol: Yes   Plan:  1) No Coumadin today 2) Daily INR  Thank you. Okey Regal, PharmD [phone]  Elwin Sleight 05/22/2012,9:28 PM

## 2012-05-22 NOTE — ED Notes (Signed)
Per EMS pt from home, [facility] RN called 911 d/t dehydration, pt is not eating or drinking 24-48 hrs, pt's BP 80/50 initially after receiving 100 cc NS pt's SBP increased to 104, 20 g RH, 3L [place]

## 2012-05-22 NOTE — Progress Notes (Signed)
Pt is confused and has pulled out his iv and pulled his tele box off.

## 2012-05-22 NOTE — ED Provider Notes (Signed)
History     CSN: [id number]  Arrival date & time 05/22/12  1804   First MD Initiated Contact with Patient 05/22/12 1818      Chief Complaint  Patient presents with  . Dehydration    (Consider location/radiation/quality/duration/timing/severity/associated sxs/prior treatment) Patient is a 79 y.o. male presenting with general illness.  Illness  Episode onset: chronic, but worsening for past 3 weeks. The onset was gradual. The problem occurs continuously. The problem has been gradually worsening. The problem is severe. Nothing relieves the symptoms. Nothing aggravates the symptoms. Pertinent negatives include no fever, no abdominal pain, no diarrhea, no nausea, no vomiting, no congestion and no cough. Associated symptoms comments: Drastically decreased PO intake.  Generalized weakness.    Past Medical History  Diagnosis Date  . HTN (hypertension)   . Dementia   . History of CVA (cerebrovascular accident)     no residual deficit (this apparently occured shortly after coming off of aspirin for cataract surgery.  Of note, old records show MRI head w/out contrast 07/2010 showed chronic small vessel infarctions w/in the pons + chronic small vessel dz throughout the brain.  . Atrial fibrillation   . Hearing impairment in [facility] 2    Bilateral; [facility] but [facility] won't wear them  . Colon polyp 2002  . History of dizziness   . Elevated PSA 2012    PSA 28.13 Apr 2010: Urology eval (Dr. Cleone Slim)  . SCC (squamous cell carcinoma), leg 2010    left  . History of rib fracture 2006    4 on right; nondisplaced (s/p lawn mower fell on him)  . Vertebral compression fracture     30% anterior wedge compression deformity T11    Past Surgical History  Procedure Laterality Date  . Middle ear surgery      Cholesteatoma-?external ear surgery, too.    . Inguinal hernia repair      right  . Colonoscopy w/ polypectomy  2002  . Tonsillectomy      History reviewed. No pertinent family  history.  History  Substance Use Topics  . Smoking status: Former Smoker    Types: Cigarettes    Quit date: 03/12/1968  . Smokeless tobacco: Never Used  . Alcohol Use: No      Review of Systems  Constitutional: Negative for fever.  HENT: Negative for congestion, facial swelling and trouble swallowing.   Respiratory: Negative for cough and shortness of breath.   Cardiovascular: Negative for chest pain.  Gastrointestinal: Negative for nausea, vomiting, abdominal pain and diarrhea.  Genitourinary: Negative for difficulty urinating.  All other systems reviewed and are negative.    Allergies  Review of patient's allergies indicates no known allergies.  Home Medications   Current Outpatient Rx  Name  Route  Sig  Dispense  Refill  . digoxin (LANOXIN) 0.125 MG tablet   Oral   Take 1 tablet by mouth Daily.         Marland Kitchen donepezil (ARICEPT) 10 MG tablet   Oral   Take 0.5 tablets by mouth At bedtime.         . hydrALAZINE (APRESOLINE) 25 MG tablet   Oral   Take 1 tablet by mouth Twice daily as needed. For dizziness         . lisinopril-hydrochlorothiazide (PRINZIDE,ZESTORETIC) 20-25 MG per tablet   Oral   Take 0.5 tablets by mouth Once daily.         Marland Kitchen warfarin (COUMADIN) 2.5 MG tablet  Take 1/2 tab M, W, F, S, Su and 1 tab on Tues, Thurs           BP 112/77  Temp(Src) 98.2 F (36.8 C) (Oral)  Resp 14  SpO2 98%  Physical Exam  Nursing note and vitals reviewed. Constitutional: He is oriented to person, place, and time. He appears well-developed. No distress.  Thin, elderly.  Generalized weakness.  HENT:  Head: Normocephalic and atraumatic.  Mouth/Throat: Oropharynx is clear and moist.  Eyes: Conjunctivae are normal. Pupils are equal, round, and reactive to light. No scleral icterus.  Neck: Normal range of motion. Neck supple.  Cardiovascular: Normal rate, regular rhythm, normal heart sounds and intact distal pulses.   No murmur  heard. Pulmonary/Chest: Effort normal and breath sounds normal. No stridor. No respiratory distress. He has no wheezes. He has no rales.  Abdominal: Soft. He exhibits no distension. There is no tenderness. There is no rebound and no guarding.  Musculoskeletal: Normal range of motion. He exhibits no edema.  Neurological: He is alert and oriented to person, place, and time.  Skin: Skin is warm and dry. No rash noted.  Psychiatric: He has a normal mood and affect. His behavior is normal.    ED Course  Procedures (including critical care time)   Date: 05/23/2012  Rate: 67  Rhythm: atrial fibrillation  QRS Axis: normal  Intervals: normal  ST/T Wave abnormalities: nonspecific ST/T changes  Conduction Disutrbances:none  Narrative Interpretation:   Old EKG Reviewed: none available    Labs Reviewed  CBC WITH DIFFERENTIAL - Abnormal; Notable for the following:    MCHC 36.7 (*)    Eosinophils Relative 6 (*)    All other components within normal limits  BASIC METABOLIC PANEL - Abnormal; Notable for the following:    Sodium 128 (*)    Chloride 91 (*)    Glucose, Bld 107 (*)    BUN 50 (*)    Creatinine, Ser 2.93 (*)    GFR calc non Af Amer 17 (*)    GFR calc Af Amer 20 (*)    All other components within normal limits  URINALYSIS, ROUTINE W REFLEX MICROSCOPIC - Abnormal; Notable for the following:    Color, Urine AMBER (*)    APPearance CLOUDY (*)    Bilirubin Urine SMALL (*)    All other components within normal limits  PROTIME-INR - Abnormal; Notable for the following:    Prothrombin Time 31.2 (*)    INR 3.23 (*)    All other components within normal limits  PROTIME-INR - Abnormal; Notable for the following:    Prothrombin Time 32.4 (*)    INR 3.40 (*)    All other components within normal limits  TSH  DIGOXIN LEVEL  BASIC METABOLIC PANEL  CBC   No results found.  Clinical Impression: Failure to Thrive, Acute Renal Insufficiency  MDM  79 yo male with generalized  weakness and decreased PO intake for past few weeks.  He has been unable to ambulate due to weakness for past 2-3 weeks.  Home health came to house today and saw BP of 80/50.  On initial exam, BPs normal, HR in 70's.  Nontoxic, but frail and thin.  Son says he has lost 10 lbs in past few months.  Denies other symptoms.  Labwork pending.  Will likely need admit for observation, PT, and further workup of failure to thrive.    Labwork showed acute renal insufficiency.  Consulted internal medicine who has admitted.  Rennis Petty, MD 05/23/12 [phone]

## 2012-05-22 NOTE — ED Notes (Signed)
Admitting physician at bedside

## 2012-05-23 ENCOUNTER — Inpatient Hospital Stay (HOSPITAL_COMMUNITY): Payer: Medicare Other

## 2012-05-23 DIAGNOSIS — N289 Disorder of kidney and ureter, unspecified: Secondary | ICD-10-CM

## 2012-05-23 LAB — BASIC METABOLIC PANEL
BUN: 42 mg/dL — ABNORMAL HIGH (ref 6–23)
CO2: 24 mEq/L (ref 19–32)
Chloride: 94 mEq/L — ABNORMAL LOW (ref 96–112)
GFR calc Af Amer: 26 mL/min — ABNORMAL LOW (ref >90)
Glucose, Bld: 79 mg/dL (ref 70–99)
Potassium: 3.7 mEq/L (ref 3.5–5.1)

## 2012-05-23 LAB — CBC
HCT: 36.6 % — ABNORMAL LOW (ref 39.0–52.0)
Hemoglobin: 12.8 g/dL — ABNORMAL LOW (ref 13.0–17.0)
MCH: 28.1 pg (ref 26.0–34.0)
MCHC: 35 g/dL (ref 30.0–36.0)

## 2012-05-23 MED ORDER — MEGESTROL ACETATE 400 MG/10ML PO SUSP
200.0000 mg | Freq: Every day | ORAL | Status: DC
  Administered 2012-05-23 – 2012-05-26 (×4): 200 mg via ORAL
  Filled 2012-05-23 (×4): qty 5

## 2012-05-23 MED ORDER — ENSURE COMPLETE PO LIQD
237.0000 mL | Freq: Three times a day (TID) | ORAL | Status: DC
  Administered 2012-05-23 – 2012-05-26 (×9): 237 mL via ORAL

## 2012-05-23 NOTE — Progress Notes (Signed)
INITIAL NUTRITION ASSESSMENT  DOCUMENTATION CODES Per approved criteria  -Severe malnutrition in the context of chronic illness -Underweight   INTERVENTION:  Continue Ensure Complete TID which provides 1050 kcal and 39 grams of protein  Magic Cup BID which will provide 580 kcal and 18 grams of protein  Recommend adding an appetite stimulant  NUTRITION DIAGNOSIS: Inadequate oral intake related to decreased appetite as evidenced by Alexander Lyons and son's report.   Goal: Intake to meet >/=90% estimated nutrition needs  Monitor:  Acceptance of Ensure Complete; acceptance of Magic Cup; PO's, weight trends; I/O's  Reason for Assessment: Malnutrition Screening Tool  79 y.o. male  Admitting Dx: Renal failure (ARF), acute on chronic  ASSESSMENT: Alexander Lyons dehydrated upon admission and with reports of not eating or drinking in the 24-28 hours PTA. This is a chronic condition for the Alexander Lyons and has been gradually worsening. He has been unable to ambulate for 2-3 weeks due to generalized weakness. Alexander Lyons with acute on chronic renal failure, Stage II CKD.  Alexander Lyons with hx of dementia and hearing loss. Limited information able to be obtained from Alexander Lyons due to dementia. No family present in the room at this time. Alexander Lyons currently on a Regular diet and ate 60% of breakfast this morning. Lunch tray in the room at time of visit, about 25% completion. Alexander Lyons has been ordered Ensure Complete TID. Recommend adding an appetite stimulant. Will order Magic Cup BID.   Alexander Lyons meets criteria for severe malnutrition in the context of chronic disease based on severe muscle and fat wasting and 7 pound weight loss (6% body weight) in one month.  Nutrition Focused Physical Exam:  Subcutaneous Fat:  Orbital Region: n/a Upper Arm Region: severe wasting Thoracic and Lumbar Region: n/a  Muscle:  Temple Region: severe wasting Clavicle Bone Region: severe wasting Clavicle and Acromion Bone Region: severe wasting  Edema: none  present    Height: Ht Readings from Last 1 Encounters:  05/22/12 6' (1.829 m)    Weight: Wt Readings from Last 1 Encounters:  05/23/12 116 lb 10 oz (52.9 kg)    Ideal Body Weight: 178 lbs   % Ideal Body Weight: 65%  Wt Readings from Last 10 Encounters:  05/23/12 116 lb 10 oz (52.9 kg)  05/02/12 123 lb (55.792 kg)  01/25/12 125 lb (56.7 kg)    Usual Body Weight: 125 lbs  % Usual Body Weight: 93%  BMI:  Body mass index is 15.81 kg/(m^2).-Underweight  Estimated Nutritional Needs: Kcal: 1600-1800 Protein: 80-90 grams Fluid: 1.6-1.8 L/day  Skin: intact, no wounds noted  Diet Order: General  EDUCATION NEEDS: -No education needs identified at this time   Intake/Output Summary (Last 24 hours) at 05/23/12 1435 Last data filed at 05/23/12 0900  Gross per 24 hour  Intake 1884.58 ml  Output      0 ml  Net 1884.58 ml    Last BM: PTA   Labs:   Recent Labs Lab 05/22/12 1839 05/23/12 0555  NA 128* 128*  K 4.3 3.7  CL 91* 94*  CO2 25 24  BUN 50* 42*  CREATININE 2.93* 2.33*  CALCIUM 9.0 8.5  GLUCOSE 107* 79    CBG (last 3)  No results found for this basename: GLUCAP,  in the last 72 hours  Scheduled Meds: . digoxin  0.125 mg Oral Daily  . donepezil  5 mg Oral QHS  . feeding supplement  237 mL Oral TID WC  . sodium chloride  3 mL Intravenous Q12H  Continuous Infusions: . sodium chloride 100 mL/hr at 05/23/12 5784    Past Medical History  Diagnosis Date  . HTN (hypertension)   . Dementia   . History of CVA (cerebrovascular accident)     no residual deficit (this apparently occured shortly after coming off of aspirin for cataract surgery.  Of note, old records show MRI head w/out contrast 07/2010 showed chronic small vessel infarctions w/in the pons + chronic small vessel dz throughout the brain.  . Atrial fibrillation   . Hearing impairment in [facility]    Bilateral; [facility] but [facility] won't wear them  . Colon polyp 2002  . History of  dizziness   . Elevated PSA 2012    PSA 28.13 Apr 2010: Urology eval (Dr. Cleone Slim)  . SCC (squamous cell carcinoma), leg 2010    left  . History of rib fracture 2006    4 on right; nondisplaced (s/p lawn mower fell on him)  . Vertebral compression fracture     30% anterior wedge compression deformity T11    Past Surgical History  Procedure Laterality Date  . Middle ear surgery      Cholesteatoma-?external ear surgery, too.    . Inguinal hernia repair      right  . Colonoscopy w/ polypectomy  2002  . Tonsillectomy      Trenton Gammon Dietetic Intern # [phone]  Maureen Chatters, RD, LDN Pager #: [phone] After-Hours Pager #: [phone]

## 2012-05-23 NOTE — ED Provider Notes (Signed)
I saw and evaluated the patient, reviewed the resident's note and I agree with the findings and plan. Pt with generalized weakness, decreased po intake. Appears dehydrated. Renal insuff on labs. Ivf, admit.   Suzi Roots, MD 05/23/12 [phone]

## 2012-05-23 NOTE — Clinical Documentation Improvement (Signed)
MALNUTRITION DOCUMENTATION CLARIFICATION  THIS DOCUMENT IS NOT A PERMANENT PART OF THE MEDICAL RECORD  TO RESPOND TO THE THIS QUERY, FOLLOW THE INSTRUCTIONS BELOW:  1. If needed, update documentation for the patient's encounter via the notes activity.  2. Access this query again and click edit on the In [employer].  3. After updating, or not, click F2 to complete all highlighted (required) fields concerning your review. Select "additional documentation in the medical record" OR "no additional documentation provided".  4. Click Sign note button.  5. The deficiency will fall out of your In Basket *Please let us know if you are not able to complete this workflow by phone or e-mail (listed below).  Please update your documentation within the medical record to reflect your response to this query.                                                                                        05/23/12   Dear Dr. Jerral Ralph / Associates,  In a better effort to capture your patient's severity of illness, reflect appropriate length of stay and utilization of resources, a review of the patient medical record has revealed the following indicators.    Based on your clinical judgment, please clarify and document in a progress note and/or discharge summary the clinical condition associated with the following supporting information:  In responding to this query please exercise your independent judgment.  The fact that a query is asked, does not imply that any particular answer is desired or expected.   Possible Clinical Conditions?  _____Severe Malnutrition    _____Severe Protein Calorie Malnutrition  _____Other Condition  _____Cannot clinically determine     Supporting Information: Risk Factors: Frail, thin, 10 pound weight loss, FTT secondary to acute medical illness complicating underlying dementia noted per 3/13 progress notes.  Signs & Symptoms: Ht: 6'     Wt: 114lb  BMI:  15.6   You may  use possible, probable, or suspect with inpatient documentation. possible, probable, suspected diagnoses MUST be documented at the time of discharge  Reviewed: No additional documentation provided.  Thank You,  Marciano Sequin,  Clinical Documentation Specialist:  Pager: [phone]  Phone: [phone]  Health Information Management [place]

## 2012-05-23 NOTE — Care Management Note (Signed)
    Page 1 of 1   05/26/2012     2:34:30 PM   CARE MANAGEMENT NOTE 05/26/2012  Patient:  Alexander Lyons, Alexander Lyons   Account Number:  [id number]  Date Initiated:  05/23/2012  Documentation initiated by:  Letha Cape  Subjective/Objective Assessment:   dx renal failure  admit-= lives with spouse.     Action/Plan:   pt eval- rec snf.   Anticipated DC Date:  05/26/2012   Anticipated DC Plan:  SKILLED NURSING FACILITY  In-house referral  Clinical Social Worker      DC Planning Services  CM consult      Choice offered to / List presented to:             Status of service:  Completed, signed off Medicare Important Message given?   (If response is "NO", the following Medicare IM given date fields will be blank) Date Medicare IM given:   Date Additional Medicare IM given:    Discharge Disposition:  SKILLED NURSING FACILITY  Per UR Regulation:  Reviewed for med. necessity/level of care/duration of stay  If discussed at Long Length of Stay Meetings, dates discussed:    Comments:  05/26/12 14:33 Letha Cape RN,BSN [phone] pt dc to [facility], CSW following.  05/23/12 16:43 Letha Cape RN, BSN [phone] patient lives with spouse, patient had a HHRN prior to hospitalization.  Per physical therapy recs snf, CSW referral.

## 2012-05-23 NOTE — Evaluation (Signed)
Physical Therapy Evaluation Patient Details Name: Alexander Lyons MRN: [id number] DOB: [birth year] Today's Date: 05/23/2012 Time: 4098-1191 PT Time Calculation (min): 15 min  PT Assessment / Plan / Recommendation Clinical Impression  Pt adm with acute renal failure, dehydration and weakness.  Pt currently requiring assis for mobility.  Needs skilled PT to maximize I and safety so pt can eventually return to his apt with his wife.  Recommend PT go to [facility] prior to return home due to decr mobility and decr cognition.    PT Assessment  Patient needs continued PT services    Follow Up Recommendations  SNF    Does the patient have the potential to tolerate intense rehabilitation      Barriers to Discharge Decreased caregiver support wife is 77 years old.    Equipment Recommendations  None recommended by PT    Recommendations for Other Services     Frequency Min 3X/week    Precautions / Restrictions Precautions Precautions: Fall   Pertinent Vitals/Pain N/A      Mobility  Bed Mobility Bed Mobility: Supine to Sit;Sitting - Scoot to [place] of Bed;Sit to Supine Supine to Sit: 4: Min assist Sitting - Scoot to Edge of Bed: 3: Mod assist Sit to Supine: 4: Min assist Details for Bed Mobility Assistance: Assist to bring trunk up and hips to EOB.  Assist to bring legs back up into bed. Transfers Transfers: Sit to Stand;Stand to Sit Sit to Stand: 4: Min assist;With upper extremity assist;From bed Stand to Sit: 4: Min assist;With upper extremity assist;To bed Details for Transfer Assistance: Assist to bring hips up and extend trunk, hips, and knees. Ambulation/Gait Ambulation/Gait Assistance: 4: Min assist Ambulation Distance (Feet): 150 Feet Assistive device: 1 person hand held assist Ambulation/Gait Assistance Details: Verbal cues to stand more erect. Gait Pattern: Step-through pattern;Decreased stride length;Narrow base of support;Trunk flexed Gait velocity: decr    Exercises      PT Diagnosis: Difficulty walking;Generalized weakness  PT Problem List: Decreased strength;Decreased activity tolerance;Decreased balance;Decreased mobility;Decreased knowledge of precautions;Decreased knowledge of use of DME;Decreased safety awareness PT Treatment Interventions: DME instruction;Gait training;Patient/family education;Functional mobility training;Therapeutic activities;Therapeutic exercise;Balance training   PT Goals Acute Rehab PT Goals PT Goal Formulation: Patient unable to participate in goal setting Time For Goal Achievement: 05/30/12 Potential to Achieve Goals: Good Pt will go Supine/Side to Sit: with supervision PT Goal: Supine/Side to Sit - Progress: Goal set today Pt will go Sit to Supine/Side: with supervision PT Goal: Sit to Supine/Side - Progress: Goal set today Pt will go Sit to Stand: with supervision PT Goal: Sit to Stand - Progress: Goal set today Pt will go Stand to Sit: with supervision PT Goal: Stand to Sit - Progress: Goal set today Pt will Ambulate: 51 - 150 feet;with supervision;with least restrictive assistive device PT Goal: Ambulate - Progress: Goal set today  Visit Information  Last PT Received On: 05/23/12 Assistance Needed: +1    Subjective Data  Subjective: Pt stated he had lived at [facility] for several months. Patient Stated Goal: Pt didn't state.   Prior Functioning  Home Living Lives With: Spouse Available Help at Discharge: Family Type of Home: Apartment Home Access: Level entry Home Layout: One level Home Adaptive Equipment: None Prior Function Level of Independence: Independent Vocation: Retired Comments: Pt was independent with mobility until the last two weeks. Communication Communication: HOH    Cognition  Cognition Overall Cognitive Status: History of cognitive impairments - further impaired Area of Impairment: Memory;Safety/judgement;Awareness of deficits Arousal/Alertness: Awake/alert  Behavior During  Session: [facility] for tasks performed Memory: Decreased recall of precautions Safety/Judgement: Decreased awareness of safety precautions;Decreased safety judgement for tasks assessed;Decreased awareness of need for assistance    Extremity/Trunk Assessment Right Lower Extremity Assessment RLE ROM/Strength/Tone: Deficits RLE ROM/Strength/Tone Deficits: grossly 4/5 Left Lower Extremity Assessment LLE ROM/Strength/Tone: Deficits LLE ROM/Strength/Tone Deficits: grossly 4/5   Balance Balance Balance Assessed: Yes Static Standing Balance Static Standing - Balance Support: Left upper extremity supported Static Standing - Level of Assistance: 4: Min assist  End of Session PT - End of Session Equipment Utilized During Treatment: Gait belt Activity Tolerance: Patient tolerated treatment well Patient left: in bed;with call bell/phone within reach;with bed alarm set Nurse Communication: Mobility status  GP     Trei Schoch 05/23/2012, 12:01 PM  [employer] PT [phone]

## 2012-05-23 NOTE — Progress Notes (Signed)
ANTICOAGULATION CONSULT NOTE - Follow Up Consult  Pharmacy Consult for Coumadin Indication: atrial fibrillation  No Known Allergies  Patient Measurements: Height: 6' (182.9 cm) Weight: 116 lb 10 oz (52.9 kg) IBW/kg (Calculated) : 77.6  Vital Signs: Temp: 98.4 F (36.9 C) (03/14 1420) Temp src: Oral (03/14 1420) BP: 107/60 mmHg (03/14 1420) Pulse Rate: 52 (03/14 1420)  Labs:  Recent Labs  05/22/12 1839 05/22/12 2138 05/23/12 0555 05/23/12 1448  HGB 14.6  --  12.8*  --   HCT 39.8  --  36.6*  --   PLT 366  --  353  --   LABPROT 31.2* 32.4*  --  34.6*  INR 3.23* 3.40*  --  3.71*  CREATININE 2.93*  --  2.33*  --     Estimated Creatinine Clearance: 14.8 ml/min (by C-G formula based on Cr of 2.33).  Assessment: 79 year old male on Coumadin PTA for Afib. Admitted with dehydration and not eating. Admit INR elevated and continues to rise to 3.71 despite coumadin held yesterday. Lack of diet is likely the reason for this.   Dose PTA = 1.25 mg MTThSat, 2.5 mg WF   Goal of Therapy:  INR 2-3 Monitor platelets by anticoagulation protocol: Yes   Plan:  No coumadin today  F/u INR tomorrow  Thank you,  Brett Fairy, PharmD, BCPS 05/23/2012 3:39 PM

## 2012-05-23 NOTE — Progress Notes (Signed)
PATIENT DETAILS Name: Alexander Lyons Age: 79 y.o. Sex: male Date of Birth: 1917/03/08 Admit Date: 05/22/2012 Admitting Physician Elease Etienne, MD RUE:[id number],XBJYNW H, MD  Subjective: Awake and mostly alert. Answers almost all of my questions appropriately. Minimally confused  Assessment/Plan: Principal Problem:   Renal failure (ARF), acute on chronic stage II -Likely multifactorial-but predominantly from dehydration. BP medications-ACE inhibitor/hydrochlorothiazide and transient hypotension contributing -Cautiously hydrate, anticipate as BP stabilizes-renal function will slowly get better -Creatinine already downtrending this morning -Monitor electrolytes periodically  Hyponatremic dehydration:  -Secondary to poor oral intake and diuretics.  -Sodium essentially unchanged -Appears clinically almost euvolemic-well decreased IV fluids to 50 cc an hour-patient was on hydrochlorothiazide prior to admission-suspect that if we do further studies it may not be accurate. -Recheck electrolytes in the morning, if hyponatremia is not improved then will initiate further workup with serum osmolality, urine osmolality and urine sodium. Would check TSH and random cortisol level as well if sodium does not improve by tomorrow.  Hypotension: - Secondary to dehydration and antihypertensives -Resolved - Continue to hold off on resuming antihypertensive medications  Atrial fibrillation on chronic Coumadin:  -Controlled ventricular rate.  -Coumadin per pharmacy -Continue digoxin-levels okay on admission  Dementia -This morning awakened and mostly alert-intermittently confused -Continue with Aricept  Failure to thrive -Start supplements -Encourage oral intake -PT evaluation-and encourage mobilization -Per patient's son, patient has not been eating well for the past 2 weeks.  Elevated PSA -Outpatient followup with PCP on discharge.  Disposition: Remain inpatient  DVT  Prophylaxis: Not needed as on Coumadin  Code Status: Full code   Procedures:  none  CONSULTS:  None  PHYSICAL EXAM: Vital signs in last 24 hours: Filed Vitals:   05/22/12 2045 05/22/12 2218 05/23/12 0500 05/23/12 1028  BP: 114/56 127/68 114/70   Pulse: 61 74 88 79  Temp:  97.4 F (36.3 C) 97.8 F (36.6 C)   TempSrc:  Axillary Oral   Resp: 17 18 20    Height:  6' (1.829 m)    Weight:  52 kg (114 lb 10.2 oz) 52.9 kg (116 lb 10 oz)   SpO2: 99% 95% 96%     Weight change:  Body mass index is 15.81 kg/(m^2).   Gen Exam: Awake and alert with clear speech.  Minimally confused this morning Neck: Supple, No JVD.   Chest: B/L Clear.   CVS: S1 S2 Regular, no murmurs.  Abdomen: soft, BS +, non tender, non distended.  Extremities: no edema, lower extremities warm to touch. Neurologic: Non Focal.  Skin: No Rash.   Wounds: N/A.    Intake/Output from previous day:  Intake/Output Summary (Last 24 hours) at 05/23/12 1415 Last data filed at 05/23/12 0900  Gross per 24 hour  Intake 1884.58 ml  Output      0 ml  Net 1884.58 ml     LAB RESULTS: CBC  Recent Labs Lab 05/22/12 1839 05/23/12 0555  WBC 9.0 8.7  HGB 14.6 12.8*  HCT 39.8 36.6*  PLT 366 353  MCV 81.1 80.3  MCH 29.7 28.1  MCHC 36.7* 35.0  RDW 14.0 14.3  LYMPHSABS 1.4  --   MONOABS 0.7  --   EOSABS 0.5  --   BASOSABS 0.0  --     Chemistries   Recent Labs Lab 05/22/12 1839 05/23/12 0555  NA 128* 128*  K 4.3 3.7  CL 91* 94*  CO2 25 24  GLUCOSE 107* 79  BUN 50* 42*  CREATININE 2.93* 2.33*  CALCIUM  9.0 8.5    CBG: No results found for this basename: GLUCAP,  in the last 168 hours  GFR Estimated Creatinine Clearance: 14.8 ml/min (by C-G formula based on Cr of 2.33).  Coagulation profile  Recent Labs Lab 05/20/12 1200 05/22/12 1839 05/22/12 2138  INR 1.8* 3.23* 3.40*  PROTIME 21.1  --   --     Cardiac Enzymes No results found for this basename: CK, CKMB, TROPONINI, MYOGLOBIN,  in  the last 168 hours  No components found with this basename: POCBNP,  No results found for this basename: DDIMER,  in the last 72 hours No results found for this basename: HGBA1C,  in the last 72 hours No results found for this basename: CHOL, HDL, LDLCALC, TRIG, CHOLHDL, LDLDIRECT,  in the last 72 hours  Recent Labs  05/22/12 1839  TSH 2.654   No results found for this basename: VITAMINB12, FOLATE, FERRITIN, TIBC, IRON, RETICCTPCT,  in the last 72 hours No results found for this basename: LIPASE, AMYLASE,  in the last 72 hours  Urine Studies No results found for this basename: UACOL, UAPR, USPG, UPH, UTP, UGL, UKET, UBIL, UHGB, UNIT, UROB, ULEU, UEPI, UWBC, URBC, UBAC, CAST, CRYS, UCOM, BILUA,  in the last 72 hours  MICROBIOLOGY: No results found for this or any previous visit (from the past 240 hour(s)).  RADIOLOGY STUDIES/RESULTS: No results found.  MEDICATIONS: Scheduled Meds: . digoxin  0.125 mg Oral Daily  . donepezil  5 mg Oral QHS  . feeding supplement  237 mL Oral TID WC  . sodium chloride  3 mL Intravenous Q12H   Continuous Infusions: . sodium chloride 100 mL/hr at 05/23/12 0617   PRN Meds:.acetaminophen, acetaminophen, albuterol, ondansetron (ZOFRAN) IV, ondansetron  Antibiotics: Anti-infectives   None       Jeoffrey Massed, MD  [facility] Pager:[phone]  If 7PM-7AM, please contact night-coverage [email or web address] Password [email or web address] 05/23/2012, 2:15 PM   LOS: 1 day

## 2012-05-23 NOTE — Clinical Documentation Improvement (Signed)
BMI DOCUMENTATION CLARIFICATION QUERY  THIS DOCUMENT IS NOT A PERMANENT PART OF THE MEDICAL RECORD  TO RESPOND TO THE THIS QUERY, FOLLOW THE INSTRUCTIONS BELOW:  1. If needed, update documentation for the patient's encounter via the notes activity.  2. Access this query again and click edit on the In [employer].  3. After updating, or not, click F2 to complete all highlighted (required) fields concerning your review. Select "additional documentation in the medical record" OR "no additional documentation provided".  4. Click Sign note button.  5. The deficiency will fall out of your In Basket *Please let us know if you are not able to complete this workflow by phone or e-mail (listed below).         05/23/12  Dear Dr. Jerral Ralph Marton Redwood  In an effort to better capture your patient's severity of illness, reflect appropriate length of stay and utilization of resources, a review of the patient medical record has revealed the following indicators.    Based on your clinical judgment, please clarify and document in a progress note and/or discharge summary the clinical condition associated with the following supporting information:  In responding to this query please exercise your independent judgment.  The fact that a query is asked, does not imply that any particular answer is desired or expected.    Possible Clinical conditions:   Underweight w/BMI=15.6  Other condition  Cannot Clinically determine    Risk Factors: Sign & Symptoms: Weight: 114lb Height:  6' BMI= 15.6    Reviewed: Physician documented BMI of 15.6 per 3/16 progress note. Thank You,  Marciano Sequin,  Clinical Documentation Specialist:  Pager: [phone]  Phone: [phone]  Health Information Management [place]

## 2012-05-24 LAB — BASIC METABOLIC PANEL
GFR calc Af Amer: 49 mL/min — ABNORMAL LOW (ref >90)
GFR calc non Af Amer: 42 mL/min — ABNORMAL LOW (ref >90)
Glucose, Bld: 74 mg/dL (ref 70–99)
Potassium: 3.7 mEq/L (ref 3.5–5.1)
Sodium: 133 mEq/L — ABNORMAL LOW (ref 135–145)

## 2012-05-24 LAB — PROTIME-INR
INR: 3.12 — ABNORMAL HIGH (ref 0.00–1.49)
Prothrombin Time: 30.4 seconds — ABNORMAL HIGH (ref 11.6–15.2)

## 2012-05-24 LAB — CBC
Hemoglobin: 11.3 g/dL — ABNORMAL LOW (ref 13.0–17.0)
MCHC: 35.6 g/dL (ref 30.0–36.0)

## 2012-05-24 MED ORDER — WARFARIN - PHARMACIST DOSING INPATIENT: Freq: Every day | Status: DC

## 2012-05-24 MED ORDER — DIGOXIN 125 MCG PO TABS
0.1250 mg | ORAL_TABLET | Freq: Every day | ORAL | Status: DC
  Administered 2012-05-25 – 2012-05-26 (×2): 0.125 mg via ORAL
  Filled 2012-05-24 (×2): qty 1

## 2012-05-24 MED ORDER — WARFARIN SODIUM 1 MG PO TABS
1.0000 mg | ORAL_TABLET | Freq: Once | ORAL | Status: AC
  Administered 2012-05-24: 1 mg via ORAL
  Filled 2012-05-24: qty 1

## 2012-05-24 NOTE — Clinical Social Work Placement (Signed)
Clinical Social Work Department CLINICAL SOCIAL WORK PLACEMENT NOTE 05/24/2012  Patient:  MOSHE, WENGER  Account Number:  [id number] Admit date:  05/22/2012  Clinical Social Worker:  Oswaldo Done  Date/time:  05/24/2012 04:49 PM  Clinical Social Work is seeking post-discharge placement for this patient at the following level of care:   SKILLED NURSING   (*CSW will update this form in Epic as items are completed)   05/24/2012  Patient/family provided with [facility] Department of Clinical Social Work's list of facilities offering this level of care within the geographic area requested by the patient (or if unable, by the patient's family).  05/24/2012  Patient/family informed of their freedom to choose among providers that offer the needed level of care, that participate in Medicare, Medicaid or managed care program needed by the patient, have an available bed and are willing to accept the patient.  05/24/2012  Patient/family informed of [facility]' ownership interest in [facility], as well as of the fact that they are under no obligation to receive care at this facility.  PASARR submitted to EDS on 05/24/2012 PASARR number received from EDS on 05/24/2012  FL2 transmitted to all facilities in geographic area requested by pt/family on  05/24/2012 FL2 transmitted to all facilities within larger geographic area on   Patient informed that his/her managed care company has contracts with or will negotiate with  certain facilities, including the following:     Patient/family informed of bed offers received:   Patient chooses bed at  Physician recommends and patient chooses bed at    Patient to be transferred to  on   Patient to be transferred to facility by   The following physician request were entered in Epic:   Additional Comments:  Ricke Hey, Theresia Majors [phone] (weekend)

## 2012-05-24 NOTE — Clinical Social Work Psychosocial (Signed)
Clinical Social Work Department BRIEF PSYCHOSOCIAL ASSESSMENT 05/24/2012  Patient:  ZAYVEN, POWE     Account Number:  [id number]     Admit date:  05/22/2012  Clinical Social Worker:  Oswaldo Done  Date/Time:  05/24/2012 04:36 PM  Referred by:  CSW  Date Referred:  05/23/2012 Referred for  SNF Placement   Other Referral:   Interview type:   Other interview type:    PSYCHOSOCIAL DATA Living Status:  WIFE Admitted from facility:   Level of care:   Primary support name:  Sheppard Luckenbach Primary support relationship to patient:  CHILD, ADULT Degree of support available:   Adequate    CURRENT CONCERNS Current Concerns  Post-Acute Placement   Other Concerns:    SOCIAL WORK ASSESSMENT / PLAN CSW called patient's son. Patient has been living with wife in [facility]. Son would like patient to go to [facility] upon discharge. Son unfamiliar with any other skilled nursing facilities. Patient's son did not want information sent to any other facilities. CSW and son agreed CSW would leave list of skilled nursing facilities at patient's bedside. Son will review SNF sheet and identify alternative facilities in case [facility] does not have a bed available.   Assessment/plan status:   Other assessment/ plan:   Information/referral to community resources:   SNF List    PATIENT'S/FAMILY'S RESPONSE TO PLAN OF CARE: Patient's son thanked CSW for assisting with sending patient information to [facility] and providing list of skilled nursing facilities. Weekday CSW will follow up with bed offers.        Ricke Hey, [place] [phone] (weekend)

## 2012-05-24 NOTE — Progress Notes (Signed)
PATIENT DETAILS Name: Alexander Lyons Age: 79 y.o. Sex: male Date of Birth: 05-21-1916 Admit Date: 05/22/2012 Admitting Physician Elease Etienne, MD ZOX:[id number],UJWJXB H, MD  Subjective: Awake and mostly alert.-appears somewhat confused  Assessment/Plan: Principal Problem:   Renal failure (ARF), acute on chronic stage II -Likely multifactorial-but predominantly from dehydration. BP medications-ACE inhibitor/hydrochlorothiazide and transient hypotension contributing -Cautiously hydrate-given poor PO intake -Creatinine downtrending -Monitor electrolytes periodically  Hyponatremic dehydration:  -Secondary to poor oral intake and diuretics.  -Sodium better -cautiously hydrate-recheck lytes in am  Hypotension: - Secondary to dehydration and antihypertensives -Resolved-BP stable - Continue to hold off on resuming antihypertensive medications  Atrial fibrillation on chronic Coumadin:  -Controlled ventricular rate.  -Coumadin per pharmacy -Continue digoxin-levels okay on admission  Dementia -This morning awakened and mostly alert-intermittently confused -Continue with Aricept  Failure to thrive -Start supplements -Encourage oral intake -PT evaluation-and encourage mobilization -Per patient's son, patient has not been eating well for the past 2 weeks.  Elevated PSA -Outpatient followup with PCP on discharge.  Disposition: Remain inpatient-SNF on discharge  DVT Prophylaxis: Not needed as on Coumadin  Code Status: Full code   Procedures:  none  CONSULTS:  None  PHYSICAL EXAM: Vital signs in last 24 hours: Filed Vitals:   05/23/12 2041 05/23/12 2127 05/24/12 0300 05/24/12 0539  BP:  105/58 72/46 100/60  Pulse:  63 83 75  Temp:  99.3 F (37.4 C) 97.5 F (36.4 C) 97.3 F (36.3 C)  TempSrc:  Oral Oral Oral  Resp:  20 18 20   Height:      Weight:   52.5 kg (115 lb 11.9 oz) 52.6 kg (115 lb 15.4 oz)  SpO2: 94% 85% 98% 99%    Weight change: 0.5 kg (1 lb  1.6 oz) Body mass index is 15.72 kg/(m^2).   Gen Exam: Awake and follow commands Neck: Supple, No JVD.   Chest: B/L Clear.   CVS: S1 S2 Regular, no murmurs.  Abdomen: soft, BS +, non tender, non distended.  Extremities: no edema, lower extremities warm to touch. Neurologic: Non Focal.  Skin: No Rash.   Wounds: N/A.    Intake/Output from previous day:  Intake/Output Summary (Last 24 hours) at 05/24/12 1040 Last data filed at 05/24/12 1005  Gross per 24 hour  Intake 2437.83 ml  Output      0 ml  Net 2437.83 ml     LAB RESULTS: CBC  Recent Labs Lab 05/22/12 1839 05/23/12 0555 05/24/12 0520  WBC 9.0 8.7 7.8  HGB 14.6 12.8* 11.3*  HCT 39.8 36.6* 31.7*  PLT 366 353 334  MCV 81.1 80.3 80.5  MCH 29.7 28.1 28.7  MCHC 36.7* 35.0 35.6  RDW 14.0 14.3 14.2  LYMPHSABS 1.4  --   --   MONOABS 0.7  --   --   EOSABS 0.5  --   --   BASOSABS 0.0  --   --     Chemistries   Recent Labs Lab 05/22/12 1839 05/23/12 0555 05/24/12 0520  NA 128* 128* 133*  K 4.3 3.7 3.7  CL 91* 94* 105  CO2 25 24 19   GLUCOSE 107* 79 74  BUN 50* 42* 25*  CREATININE 2.93* 2.33* 1.39*  CALCIUM 9.0 8.5 6.9*    CBG: No results found for this basename: GLUCAP,  in the last 168 hours  GFR Estimated Creatinine Clearance: 24.7 ml/min (by C-G formula based on Cr of 1.39).  Coagulation profile  Recent Labs Lab 05/20/12 1200 05/22/12 1839 05/22/12  2138 05/23/12 1448 05/24/12 0520  INR 1.8* 3.23* 3.40* 3.71* 3.12*  PROTIME 21.1  --   --   --   --     Cardiac Enzymes No results found for this basename: CK, CKMB, TROPONINI, MYOGLOBIN,  in the last 168 hours  No components found with this basename: POCBNP,  No results found for this basename: DDIMER,  in the last 72 hours No results found for this basename: HGBA1C,  in the last 72 hours No results found for this basename: CHOL, HDL, LDLCALC, TRIG, CHOLHDL, LDLDIRECT,  in the last 72 hours  Recent Labs  05/22/12 1839  TSH 2.654   No  results found for this basename: VITAMINB12, FOLATE, FERRITIN, TIBC, IRON, RETICCTPCT,  in the last 72 hours No results found for this basename: LIPASE, AMYLASE,  in the last 72 hours  Urine Studies No results found for this basename: UACOL, UAPR, USPG, UPH, UTP, UGL, UKET, UBIL, UHGB, UNIT, UROB, ULEU, UEPI, UWBC, URBC, UBAC, CAST, CRYS, UCOM, BILUA,  in the last 72 hours  MICROBIOLOGY: No results found for this or any previous visit (from the past 240 hour(s)).  RADIOLOGY STUDIES/RESULTS: No results found.  MEDICATIONS: Scheduled Meds: . donepezil  5 mg Oral QHS  . feeding supplement  237 mL Oral TID WC  . megestrol  200 mg Oral Daily  . sodium chloride  3 mL Intravenous Q12H   Continuous Infusions: . sodium chloride 50 mL/hr at 05/23/12 1611   PRN Meds:.acetaminophen, acetaminophen, albuterol, ondansetron (ZOFRAN) IV, ondansetron  Antibiotics: Anti-infectives   None       Jeoffrey Massed, MD  [facility] Pager:[phone]  If 7PM-7AM, please contact night-coverage [email or web address] Password [email or web address] 05/24/2012, 10:40 AM   LOS: 2 days

## 2012-05-24 NOTE — Progress Notes (Signed)
ANTICOAGULATION CONSULT NOTE - Follow Up Consult  Pharmacy Consult for Coumadin Indication: atrial fibrillation  No Known Allergies  Patient Measurements: Height: 6' (182.9 cm) Weight: 115 lb 15.4 oz (52.6 kg) IBW/kg (Calculated) : 77.6  Vital Signs: Temp: 97.3 F (36.3 C) (03/15 0539) Temp src: Oral (03/15 0539) BP: 100/60 mmHg (03/15 0539) Pulse Rate: 75 (03/15 0539)  Labs:  Recent Labs  05/22/12 1839 05/22/12 2138 05/23/12 0555 05/23/12 1448 05/24/12 0520  HGB 14.6  --  12.8*  --  11.3*  HCT 39.8  --  36.6*  --  31.7*  PLT 366  --  353  --  334  LABPROT 31.2* 32.4*  --  34.6* 30.4*  INR 3.23* 3.40*  --  3.71* 3.12*  CREATININE 2.93*  --  2.33*  --  1.39*    Estimated Creatinine Clearance: 24.7 ml/min (by C-G formula based on Cr of 1.39).  Assessment: 79 year old male on Coumadin PTA for Afib. Admitted with dehydration and not eating. INR down to 3.12 today.   Dose PTA = 1.25 mg MTThSat, 2.5 mg WF   Goal of Therapy:  INR 2-3 Monitor platelets by anticoagulation protocol: Yes   Plan:  Coumadin 1mg  today F/u INR tomorrow  Celedonio Miyamoto, PharmD, BCPS Clinical Pharmacist Pager [phone]   05/24/2012 11:21 AM

## 2012-05-25 LAB — BASIC METABOLIC PANEL
BUN: 22 mg/dL (ref 6–23)
CO2: 22 mEq/L (ref 19–32)
Chloride: 102 mEq/L (ref 96–112)
Creatinine, Ser: 1.4 mg/dL — ABNORMAL HIGH (ref 0.50–1.35)

## 2012-05-25 LAB — PROTIME-INR: Prothrombin Time: 26.3 seconds — ABNORMAL HIGH (ref 11.6–15.2)

## 2012-05-25 MED ORDER — WARFARIN SODIUM 2 MG PO TABS
2.0000 mg | ORAL_TABLET | Freq: Once | ORAL | Status: AC
  Administered 2012-05-25: 2 mg via ORAL
  Filled 2012-05-25: qty 1

## 2012-05-25 NOTE — Progress Notes (Signed)
ANTICOAGULATION CONSULT NOTE - Follow Up Consult  Pharmacy Consult for Coumadin Indication: atrial fibrillation  No Known Allergies  Patient Measurements: Height: 6' (182.9 cm) Weight: 116 lb 13.5 oz (53 kg) IBW/kg (Calculated) : 77.6  Vital Signs: Temp: 99.2 F (37.3 C) (03/16 0636) Temp src: Oral (03/16 0636) BP: 97/54 mmHg (03/16 0636) Pulse Rate: 51 (03/16 0636)  Labs:  Recent Labs  05/22/12 1839  05/23/12 0555 05/23/12 1448 05/24/12 0520 05/25/12 0525  HGB 14.6  --  12.8*  --  11.3*  --   HCT 39.8  --  36.6*  --  31.7*  --   PLT 366  --  353  --  334  --   LABPROT 31.2*  < >  --  34.6* 30.4* 26.3*  INR 3.23*  < >  --  3.71* 3.12* 2.56*  CREATININE 2.93*  --  2.33*  --  1.39* 1.40*  < > = values in this interval not displayed.  Estimated Creatinine Clearance: 24.7 ml/min (by C-G formula based on Cr of 1.4).  Assessment: 79 year old male on Coumadin PTA for Afib. Admitted with dehydration and not eating. INR down to 2.56 today which is therapeutic.  Dose PTA = 1.25 mg MTThSatSun, 2.5 mg WF   Goal of Therapy:  INR 2-3 Monitor platelets by anticoagulation protocol: Yes   Plan:  Coumadin 2mg  today F/u INR tomorrow  Celedonio Miyamoto, PharmD, BCPS Clinical Pharmacist Pager [phone]   05/25/2012 10:45 AM

## 2012-05-25 NOTE — Progress Notes (Signed)
PATIENT DETAILS Name: Alexander Lyons Age: 79 y.o. Sex: male Date of Birth: 09-21-1916 Admit Date: 05/22/2012 Admitting Physician Elease Etienne, MD ZOX:WRUEAVW,UJWJXB H, MD  Subjective: Awake and mostly alert.-appears somewhat confused Eating much more now-appetite seems better  Assessment/Plan: Principal Problem:   Renal failure (ARF), acute on chronic stage II -Likely multifactorial-but predominantly from dehydration. BP medications-ACE inhibitor/hydrochlorothiazide and transient hypotension contributing -since oral intake improving-will decrease IVF -Creatinine downtrending -Monitor electrolytes periodically  Hyponatremic dehydration:  -Secondary to poor oral intake and diuretics.  -Sodium better -cautiously hydrate-recheck lytes periodically  Hypotension: - Secondary to dehydration and antihypertensives -Resolved-BP stable - Continue to hold off on resuming antihypertensive medications  Atrial fibrillation on chronic Coumadin:  -Controlled ventricular rate.  -Coumadin per pharmacy -Continue digoxin-levels okay on admission  Dementia - mostly alert-intermittently confused-per son-at bedside -Continue with Aricept  Failure to thrive -Start supplements -Encourage oral intake -PT evaluation-and encourage mobilization -Per patient's son, patient has not been eating well for the past 2 weeks.  Elevated PSA -Outpatient followup with PCP on discharge.  Disposition: Remain inpatient-SNF on discharge  DVT Prophylaxis: Not needed as on Coumadin  Code Status: Full code   Procedures:  none  CONSULTS:  None  PHYSICAL EXAM: Vital signs in last 24 hours: Filed Vitals:   05/24/12 0539 05/24/12 1400 05/24/12 2134 05/25/12 0636  BP: 100/60 115/53 116/46 97/54  Pulse: 75 83 83 51  Temp: 97.3 F (36.3 C) 98.1 F (36.7 C) 98.3 F (36.8 C) 99.2 F (37.3 C)  TempSrc: Oral Oral Oral Oral  Resp: 20 20 18 18   Height:      Weight: 52.6 kg (115 lb 15.4 oz)    53 kg (116 lb 13.5 oz)  SpO2: 99% 96% 95% 98%    Weight change: 0.5 kg (1 lb 1.6 oz) Body mass index is 15.84 kg/(m^2).   Gen Exam: Awake and follow commands Neck: Supple, No JVD.   Chest: B/L Clear.   CVS: S1 S2 Regular, no murmurs.  Abdomen: soft, BS +, non tender, non distended.  Extremities: no edema, lower extremities warm to touch. Neurologic: Non Focal.  Skin: No Rash.   Wounds: N/A.    Intake/Output from previous day:  Intake/Output Summary (Last 24 hours) at 05/25/12 1030 Last data filed at 05/25/12 0640  Gross per 24 hour  Intake 1401.33 ml  Output    350 ml  Net 1051.33 ml     LAB RESULTS: CBC  Recent Labs Lab 05/22/12 1839 05/23/12 0555 05/24/12 0520  WBC 9.0 8.7 7.8  HGB 14.6 12.8* 11.3*  HCT 39.8 36.6* 31.7*  PLT 366 353 334  MCV 81.1 80.3 80.5  MCH 29.7 28.1 28.7  MCHC 36.7* 35.0 35.6  RDW 14.0 14.3 14.2  LYMPHSABS 1.4  --   --   MONOABS 0.7  --   --   EOSABS 0.5  --   --   BASOSABS 0.0  --   --     Chemistries   Recent Labs Lab 05/22/12 1839 05/23/12 0555 05/24/12 0520 05/25/12 0525  NA 128* 128* 133* 132*  K 4.3 3.7 3.7 4.1  CL 91* 94* 105 102  CO2 25 24 19 22   GLUCOSE 107* 79 74 87  BUN 50* 42* 25* 22  CREATININE 2.93* 2.33* 1.39* 1.40*  CALCIUM 9.0 8.5 6.9* 8.3*    CBG: No results found for this basename: GLUCAP,  in the last 168 hours  GFR Estimated Creatinine Clearance: 24.7 ml/min (by C-G formula based  on Cr of 1.4).  Coagulation profile  Recent Labs Lab 05/20/12 1200 05/22/12 1839 05/22/12 2138 05/23/12 1448 05/24/12 0520 05/25/12 0525  INR 1.8* 3.23* 3.40* 3.71* 3.12* 2.56*  PROTIME 21.1  --   --   --   --   --     Cardiac Enzymes No results found for this basename: CK, CKMB, TROPONINI, MYOGLOBIN,  in the last 168 hours  No components found with this basename: POCBNP,  No results found for this basename: DDIMER,  in the last 72 hours No results found for this basename: HGBA1C,  in the last 72  hours No results found for this basename: CHOL, HDL, LDLCALC, TRIG, CHOLHDL, LDLDIRECT,  in the last 72 hours  Recent Labs  05/22/12 1839  TSH 2.654   No results found for this basename: VITAMINB12, FOLATE, FERRITIN, TIBC, IRON, RETICCTPCT,  in the last 72 hours No results found for this basename: LIPASE, AMYLASE,  in the last 72 hours  Urine Studies No results found for this basename: UACOL, UAPR, USPG, UPH, UTP, UGL, UKET, UBIL, UHGB, UNIT, UROB, ULEU, UEPI, UWBC, URBC, UBAC, CAST, CRYS, UCOM, BILUA,  in the last 72 hours  MICROBIOLOGY: No results found for this or any previous visit (from the past 240 hour(s)).  RADIOLOGY STUDIES/RESULTS: No results found.  MEDICATIONS: Scheduled Meds: . digoxin  0.125 mg Oral Daily  . donepezil  5 mg Oral QHS  . feeding supplement  237 mL Oral TID WC  . megestrol  200 mg Oral Daily  . sodium chloride  3 mL Intravenous Q12H  . Warfarin - Pharmacist Dosing Inpatient   Does not apply q1800   Continuous Infusions: . sodium chloride 50 mL/hr at 05/25/12 0248   PRN Meds:.acetaminophen, acetaminophen, albuterol, ondansetron (ZOFRAN) IV, ondansetron  Antibiotics: Anti-infectives   None       Jeoffrey Massed, MD  [facility] Pager:[phone]  If 7PM-7AM, please contact night-coverage [email or web address] Password [email or web address] 05/25/2012, 10:30 AM   LOS: 3 days

## 2012-05-26 DIAGNOSIS — R972 Elevated prostate specific antigen [PSA]: Secondary | ICD-10-CM

## 2012-05-26 MED ORDER — WARFARIN SODIUM 2 MG PO TABS
2.0000 mg | ORAL_TABLET | Freq: Once | ORAL | Status: DC
  Filled 2012-05-26: qty 1

## 2012-05-26 MED ORDER — MEGESTROL ACETATE 400 MG/10ML PO SUSP: 200.0000 mg | Freq: Every day | ORAL | Status: DC

## 2012-05-26 NOTE — Clinical Social Work Placement (Signed)
     Clinical Social Work Department CLINICAL SOCIAL WORK PLACEMENT NOTE 05/26/2012  Patient:  Alexander Lyons, Alexander Lyons  Account Number:  [id number] Admit date:  05/22/2012  Clinical Social Worker:  Oswaldo Done  Date/time:  05/24/2012 04:49 PM  Clinical Social Work is seeking post-discharge placement for this patient at the following level of care:   SKILLED NURSING   (*CSW will update this form in Epic as items are completed)   05/24/2012  Patient/family provided with [facility] Department of Clinical Social Works list of facilities offering this level of care within the geographic area requested by the patient (or if unable, by the patients family).  05/24/2012  Patient/family informed of their freedom to choose among providers that offer the needed level of care, that participate in Medicare, Medicaid or managed care program needed by the patient, have an available bed and are willing to accept the patient.  05/24/2012  Patient/family informed of [facility] ownership interest in [facility], as well as of the fact that they are under no obligation to receive care at this facility.  PASARR submitted to EDS on 05/24/2012 PASARR number received from EDS on 05/24/2012  FL2 transmitted to all facilities in geographic area requested by pt/family on  05/24/2012 FL2 transmitted to all facilities within larger geographic area on   Patient informed that his/her managed care company has contracts with or will negotiate with  certain facilities, including the following:   NA     Patient/family informed of bed offers received:  05/26/2012 Patient chooses bed at [place], [facility] Physician recommends and patient chooses bed at    Patient to be transferred to [place], [facility] on  06/02/2012 Patient to be transferred to facility by Ambulance  Sharin Mons)  The following physician request were entered in Epic:   Additional Comments: 05/26/12  Patient was  residing in the independent living unit of [facility]. He is now going to SNF for therapy. Patient has dementia and was unable to partiicpate in d/c plan.  Patient's son was very pleased with d/c plan. CSW signing off.

## 2012-05-26 NOTE — Progress Notes (Signed)
Pt. discharged to floor,verbalized understanding of discharged instruction,medication,restriction,diet and follow up appointment.Baseline Vitals sign stable,Pt comfortable,no sign and symptom of distress.Report called at [facility] to [employer]

## 2012-05-26 NOTE — Progress Notes (Signed)
ANTICOAGULATION CONSULT NOTE - Follow Up Consult  Pharmacy Consult:  Coumadin Indication: atrial fibrillation  No Known Allergies  Patient Measurements: Height: 6' (182.9 cm) Weight: 117 lb 11.6 oz (53.4 kg) IBW/kg (Calculated) : 77.6  Vital Signs: Temp: 98.9 F (37.2 C) (03/17 0514) Temp src: Oral (03/17 0514) BP: 107/62 mmHg (03/17 0514) Pulse Rate: 61 (03/17 0514)  Labs:  Recent Labs  05/24/12 0520 05/25/12 0525 05/26/12 0628  HGB 11.3*  --   --   HCT 31.7*  --   --   PLT 334  --   --   LABPROT 30.4* 26.3* 24.1*  INR 3.12* 2.56* 2.28*  CREATININE 1.39* 1.40*  --     Estimated Creatinine Clearance: 24.9 ml/min (by C-G formula based on Cr of 1.4).      Assessment: 79 year old male continues on Coumadin for history of AFib.  INR remains therapeutic - it has trended down, as expected, due to doses previously held and then resumed at a lower dose.  No bleeding reported.   Goal of Therapy:  INR 2-3 Monitor platelets by anticoagulation protocol: Yes    Plan:  - Coumadin 2mg  PO today - Daily PT / INR     Erendira Crabtree D. Laney Potash, PharmD, BCPS Pager:  [phone] 05/26/2012, 8:30 AM

## 2012-05-26 NOTE — Discharge Summary (Signed)
PATIENT DETAILS Name: Alexander Lyons Age: 79 y.o. Sex: male Date of Birth: 06-28-1916 MRN: [id number]. Admit Date: 05/22/2012 Admitting Physician: Elease Etienne, MD NWG:[id number],QMVHQI H, MD  Recommendations for Outpatient Follow-up:  1. Please monitor INR closely 2. Please recheck BMET in 1 week 3. Patient currently off all antihypertensive medications-please evaluate up on next visit-whether or not to restart  PRIMARY DISCHARGE DIAGNOSIS:  Principal Problem:   Renal failure (ARF), acute on chronic Active Problems:   HTN (hypertension)   Dementia   Elevated PSA   Hypotension   Dehydration with hyponatremia   A-fib on Coumadin      PAST MEDICAL HISTORY: Past Medical History  Diagnosis Date  . HTN (hypertension)   . Dementia   . History of CVA (cerebrovascular accident)     no residual deficit (this apparently occured shortly after coming off of aspirin for cataract surgery.  Of note, old records show MRI head w/out contrast 07/2010 showed chronic small vessel infarctions w/in the pons + chronic small vessel dz throughout the brain.  . Atrial fibrillation   . Hearing impairment in [facility]    Bilateral; [facility] but [facility] won't wear them  . Colon polyp 2002  . History of dizziness   . Elevated PSA 2012    PSA 28.13 Apr 2010: Urology eval (Dr. Cleone Slim)  . SCC (squamous cell carcinoma), leg 2010    left  . History of rib fracture 2006    4 on right; nondisplaced (s/p lawn mower fell on him)  . Vertebral compression fracture     30% anterior wedge compression deformity T11    DISCHARGE MEDICATIONS:   Medication List    STOP taking these medications       hydrALAZINE 25 MG tablet  Commonly known as:  APRESOLINE     lisinopril-hydrochlorothiazide 20-25 MG per tablet  Commonly known as:  PRINZIDE,ZESTORETIC      TAKE these medications       digoxin 0.125 MG tablet  Commonly known as:  LANOXIN  Take 0.125 mg by mouth daily.     donepezil 10 MG tablet   Commonly known as:  ARICEPT  Take 5 mg by mouth at bedtime.     megestrol 400 MG/10ML suspension  Commonly known as:  MEGACE  Take 5 mLs (200 mg total) by mouth daily.     warfarin 2.5 MG tablet  Commonly known as:  COUMADIN  Take 1.25-2.5 mg by mouth daily. Half tablet (1.25mg ) Monday, Tuesday, Thursday, Saturday, Sunday; 1 tablet (2.5mg ) Wednesday, Friday       BRIEF HPI:  See H&P, Labs, Consult and Test reports for all details in brief, patient is a 79 year old Caucasian male-resident of independent living facility with a past medical history of hypertension, mentioned, atrial fibrillation on chronic Coumadin therapy who presented with a two-week history of poor oral intake generalized weakness and hypotension. Was found to have acute renal failure and hyponatremia.  CONSULTATIONS:   None  PERTINENT RADIOLOGIC STUDIES: US Renal  05/23/2012  *RADIOLOGY REPORT*  Clinical Data: Acute renal failure.  Question obstruction?  High blood pressure.  RENAL/URINARY TRACT ULTRASOUND COMPLETE  Comparison:  None.  Findings:  Right Kidney:  10.2 cm.  No hydronephrosis.  Upper pole 6.5 cm complex cyst with nodularity/septation.  Renal parenchymal thinning.  Left Kidney:  10.1 cm. No hydronephrosis or renal mass. Renal parenchymal thinning.  Bladder:  Bilateral ureteral jets noted.  Enlarged lobulated prostate gland indents the bladder base.  IMPRESSION: No evidence of hydronephrosis.  Complex 6.5 cm right renal cyst with nodularity/septation. Recommend follow-up renal sonogram in 6 months.  Renal parenchymal thinning bilaterally.  Enlarged lobulated prostate gland.  Clinical and laboratory correlation recommended.   Original Report Authenticated By: Lacy Duverney, M.D.      PERTINENT LAB RESULTS: CBC:  Recent Labs  05/24/12 0520  WBC 7.8  HGB 11.3*  HCT 31.7*  PLT 334   CMET CMP     Component Value Date/Time   NA 132* 05/25/2012 0525   K 4.1 05/25/2012 0525   CL 102 05/25/2012 0525   CO2  22 05/25/2012 0525   GLUCOSE 87 05/25/2012 0525   BUN 22 05/25/2012 0525   CREATININE 1.40* 05/25/2012 0525   CREATININE 1.28 01/25/2012 1616   CALCIUM 8.3* 05/25/2012 0525   PROT 7.2 05/02/2012 1503   ALBUMIN 3.5 05/02/2012 1503   AST 13 05/02/2012 1503   ALT 9 05/02/2012 1503   ALKPHOS 56 05/02/2012 1503   BILITOT 0.8 05/02/2012 1503   GFRNONAA 42* 05/25/2012 0525   GFRAA 48* 05/25/2012 0525    GFR Estimated Creatinine Clearance: 24.9 ml/min (by C-G formula based on Cr of 1.4). No results found for this basename: LIPASE, AMYLASE,  in the last 72 hours No results found for this basename: CKTOTAL, CKMB, CKMBINDEX, TROPONINI,  in the last 72 hours No components found with this basename: POCBNP,  No results found for this basename: DDIMER,  in the last 72 hours No results found for this basename: HGBA1C,  in the last 72 hours No results found for this basename: CHOL, HDL, LDLCALC, TRIG, CHOLHDL, LDLDIRECT,  in the last 72 hours No results found for this basename: TSH, T4TOTAL, FREET3, T3FREE, THYROIDAB,  in the last 72 hours No results found for this basename: VITAMINB12, FOLATE, FERRITIN, TIBC, IRON, RETICCTPCT,  in the last 72 hours Coags:  Recent Labs  05/25/12 0525 05/26/12 0628  INR 2.56* 2.28*   Microbiology: No results found for this or any previous visit (from the past 240 hour(s)).   BRIEF HOSPITAL COURSE:   Principal Problem:   Renal failure (ARF), acute on chronic -Likely multifactorial-but predominantly from dehydration. BP medications-ACE inhibitor/hydrochlorothiazide and transient hypotension contributing - Patient as admitted, given IV fluids, creatinine on admission was 2.93, with IV fluids, this improved and by day of discharge there was decreased to 1.40 - Patient has been encouraged to increase his intake, has been placed on Megace. Clinically he is euvolemic- his oral intake has increased, the nursing staff and the family also claimed that he has started to eat much  more than prior to hospitalization. - Hopefully he can sustain enough oral intake to make sure that he does not go into hyponatremia and renal failure again, his labs will need to be checked next week.  Active Problems: Hyponatremic dehydration:  -Secondary to poor oral intake and diuretics.  -Sodium better- recheck electrolytes next week. Her sodium on admission was 128, on discharge it had increased 132  Hypotension:  - Secondary to dehydration and antihypertensives  -Resolved-BP stable  - Continue to hold off on resuming antihypertensive medications- reassess the need to start antihypertensive medications in the outpatient setting  Atrial fibrillation on chronic Coumadin:  -Controlled ventricular rate.  - Continue digoxin - Darla supratherapeutic on admission at 3.40, by the day of discharge it was 2.28 - Please recheck INR levels at least once or twice a week while at the skilled nursing facility   Dementia  - mostly  alert-intermittently confused-per son-at bedside  -Continue with Aricept   Elevated PSA  -Outpatient followup with PCP on discharge.   TODAY-DAY OF DISCHARGE:  Subjective:   Tyrion Glaude today has no headache,no chest abdominal pain,no new weakness tingling or numbness, feels much better.   Objective:   Blood pressure 119/64, pulse 63, temperature 98.9 F (37.2 C), temperature source Oral, resp. rate 20, height 6' (1.829 m), weight 53.4 kg (117 lb 11.6 oz), SpO2 95.00%.  Intake/Output Summary (Last 24 hours) at 05/26/12 1112 Last data filed at 05/26/12 1033  Gross per 24 hour  Intake 656.67 ml  Output    375 ml  Net 281.67 ml    Exam Awake Alert, Oriented *3, No new F.N deficits, Normal affect [place].AT,PERRAL Supple Neck,No JVD, No cervical lymphadenopathy appriciated.  Symmetrical Chest wall movement, Good air movement bilaterally, CTAB RRR,No Gallops,Rubs or new Murmurs, No Parasternal Heave +ve B.Sounds, Abd Soft, Non tender, No organomegaly  appriciated, No rebound -guarding or rigidity. No Cyanosis, Clubbing or edema, No new Rash or bruise  DISCHARGE CONDITION: Stable  DISPOSITION: SNF  DISCHARGE INSTRUCTIONS:    Activity:  As tolerated with Full fall precautions use walker/cane & assistance as needed  Diet recommendation: Heart Healthy diet      Follow-up Information   Follow up with MCGOWEN,PHILIP H, MD. Schedule an appointment as soon as possible for a visit in 2 weeks.   Contact information:   [address] [place] [address] [phone]         Total Time spent on discharge equals 45 minutes.  SignedJeoffrey Massed 05/26/2012 11:12 AM

## 2012-05-26 NOTE — Progress Notes (Signed)
Physical Therapy Treatment Patient Details Name: Alexander Lyons MRN: [id number] DOB: 1916/08/06 Today's Date: 05/26/2012 Time: 4098-1191 PT Time Calculation (min): 25 min  PT Assessment / Plan / Recommendation Comments on Treatment Session  Pt adm with weakness. Pt continues to have incr confusion and still requiring assist to amb. Feel pt can still benefit from ST-SNF.    Follow Up Recommendations  SNF     Does the patient have the potential to tolerate intense rehabilitation     Barriers to Discharge        Equipment Recommendations  None recommended by PT    Recommendations for Other Services    Frequency Min 3X/week   Plan Discharge plan remains appropriate;Frequency remains appropriate    Precautions / Restrictions Precautions Precautions: Fall   Pertinent Vitals/Pain N/A    Mobility  Transfers Sit to Stand: 4: Min guard;With upper extremity assist;With armrests;From chair/3-in-1 Stand to Sit: 4: Min guard;With upper extremity assist;With armrests;To chair/3-in-1 Details for Transfer Assistance: Assist for safety and balance Ambulation/Gait Ambulation/Gait Assistance: 4: Min assist Ambulation Distance (Feet): 300 Feet (and 150' x 1) Assistive device: Rolling walker;None Ambulation/Gait Assistance Details: Verbal cues to stay closer to walker which pt had difficulty doing so pt continued amb without walker. Gait Pattern: Step-through pattern;Decreased step length - right;Decreased step length - left;Shuffle;Festinating;Trunk flexed Gait velocity: At times gait speed began incr with forward lean with pt requiring assist to regain control.    Exercises     PT Diagnosis:    PT Problem List:   PT Treatment Interventions:     PT Goals Acute Rehab PT Goals PT Goal: Sit to Stand - Progress: Progressing toward goal PT Goal: Stand to Sit - Progress: Progressing toward goal PT Goal: Ambulate - Progress: Progressing toward goal  Visit Information  Last PT Received  On: 05/26/12 Assistance Needed: +1    Subjective Data  Subjective: "I don't know," pt stated when asked where he was.   Cognition  Cognition Overall Cognitive Status: History of cognitive impairments - further impaired Area of Impairment: Memory;Safety/judgement;Awareness of deficits Arousal/Alertness: Awake/alert Orientation Level: Disoriented to;Place;Time;Situation Behavior During Session: WFL for tasks performed Memory Deficits: Decr recall of his apt Safety/Judgement: Decreased awareness of safety precautions;Decreased safety judgement for tasks assessed;Decreased awareness of need for assistance    Balance  Static Standing Balance Static Standing - Balance Support: No upper extremity supported Static Standing - Level of Assistance: 5: Stand by assistance  End of Session     GP     [facility] 05/26/2012, 10:49 AM  [facility] PT [phone]

## 2012-05-28 DIAGNOSIS — F0390 Unspecified dementia without behavioral disturbance: Secondary | ICD-10-CM

## 2012-05-28 DIAGNOSIS — I10 Essential (primary) hypertension: Secondary | ICD-10-CM

## 2012-05-28 DIAGNOSIS — R630 Anorexia: Secondary | ICD-10-CM

## 2012-05-28 DIAGNOSIS — I4891 Unspecified atrial fibrillation: Secondary | ICD-10-CM

## 2012-05-30 ENCOUNTER — Ambulatory Visit: Payer: Medicare Other | Admitting: Family Medicine

## 2012-06-14 ENCOUNTER — Emergency Department (HOSPITAL_COMMUNITY)
Admission: EM | Admit: 2012-06-14 | Discharge: 2012-06-15 | Disposition: A | Discharge status: Home / Self Care | Payer: Medicare Other | Attending: Emergency Medicine | Admitting: Emergency Medicine

## 2012-06-14 ENCOUNTER — Encounter (HOSPITAL_COMMUNITY): Payer: Self-pay | Admitting: Emergency Medicine

## 2012-06-14 ENCOUNTER — Emergency Department (HOSPITAL_COMMUNITY): Payer: Medicare Other

## 2012-06-14 DIAGNOSIS — Z8673 Personal history of transient ischemic attack (TIA), and cerebral infarction without residual deficits: Secondary | ICD-10-CM | POA: Insufficient documentation

## 2012-06-14 DIAGNOSIS — F0390 Unspecified dementia without behavioral disturbance: Secondary | ICD-10-CM

## 2012-06-14 DIAGNOSIS — Z79899 Other long term (current) drug therapy: Secondary | ICD-10-CM | POA: Insufficient documentation

## 2012-06-14 DIAGNOSIS — I10 Essential (primary) hypertension: Secondary | ICD-10-CM | POA: Insufficient documentation

## 2012-06-14 DIAGNOSIS — Z7901 Long term (current) use of anticoagulants: Secondary | ICD-10-CM | POA: Insufficient documentation

## 2012-06-14 DIAGNOSIS — Z860100 Personal history of colon polyps, unspecified: Secondary | ICD-10-CM | POA: Insufficient documentation

## 2012-06-14 DIAGNOSIS — Z8669 Personal history of other diseases of the nervous system and sense organs: Secondary | ICD-10-CM | POA: Insufficient documentation

## 2012-06-14 DIAGNOSIS — F068 Other specified mental disorders due to known physiological condition: Secondary | ICD-10-CM | POA: Insufficient documentation

## 2012-06-14 DIAGNOSIS — Z8781 Personal history of (healed) traumatic fracture: Secondary | ICD-10-CM | POA: Insufficient documentation

## 2012-06-14 DIAGNOSIS — Z87891 Personal history of nicotine dependence: Secondary | ICD-10-CM | POA: Insufficient documentation

## 2012-06-14 DIAGNOSIS — Z8679 Personal history of other diseases of the circulatory system: Secondary | ICD-10-CM | POA: Insufficient documentation

## 2012-06-14 LAB — COMPREHENSIVE METABOLIC PANEL
Alkaline Phosphatase: 47 U/L (ref 39–117)
BUN: 26 mg/dL — ABNORMAL HIGH (ref 6–23)
Calcium: 9.1 mg/dL (ref 8.4–10.5)
GFR calc non Af Amer: 51 mL/min — ABNORMAL LOW (ref >90)
Glucose, Bld: 99 mg/dL (ref 70–99)
Total Bilirubin: 0.7 mg/dL (ref 0.3–1.2)
Total Protein: 8.2 g/dL (ref 6.0–8.3)

## 2012-06-14 LAB — URINALYSIS, ROUTINE W REFLEX MICROSCOPIC
Bilirubin Urine: NEGATIVE
Glucose, UA: NEGATIVE mg/dL
Hgb urine dipstick: NEGATIVE
Protein, ur: 30 mg/dL — AB
Urobilinogen, UA: 4 mg/dL — ABNORMAL HIGH (ref 0.0–1.0)

## 2012-06-14 LAB — CBC WITH DIFFERENTIAL/PLATELET
Eosinophils Absolute: 0 10*3/uL (ref 0.0–0.7)
Eosinophils Relative: 0 % (ref 0–5)
Hemoglobin: 13.8 g/dL (ref 13.0–17.0)
Lymphs Abs: 1.6 10*3/uL (ref 0.7–4.0)
MCH: 30 pg (ref 26.0–34.0)
MCV: 85.4 fL (ref 78.0–100.0)
Monocytes Absolute: 1.5 10*3/uL — ABNORMAL HIGH (ref 0.1–1.0)
Monocytes Relative: 14 % — ABNORMAL HIGH (ref 3–12)
RBC: 4.6 MIL/uL (ref 4.22–5.81)

## 2012-06-14 LAB — URINE MICROSCOPIC-ADD ON

## 2012-06-14 LAB — PROTIME-INR
INR: 2.1 — ABNORMAL HIGH (ref 0.00–1.49)
Prothrombin Time: 22.7 seconds — ABNORMAL HIGH (ref 11.6–15.2)

## 2012-06-14 MED ORDER — SODIUM CHLORIDE 0.9 % IV BOLUS (SEPSIS)
500.0000 mL | Freq: Once | INTRAVENOUS | Status: AC
  Administered 2012-06-14: 500 mL via INTRAVENOUS

## 2012-06-14 MED ORDER — SODIUM CHLORIDE 0.9 % IV SOLN
Freq: Once | INTRAVENOUS | Status: AC
  Administered 2012-06-14: 22:00:00 via INTRAVENOUS

## 2012-06-14 NOTE — ED Notes (Signed)
Per son - pt c/o pain all over and not wanting to eat or drink. Pt c/o pain to right leg, but unable to pinpoint location. Pt in nad, skin warm and dry, resp e/u.

## 2012-06-14 NOTE — ED Provider Notes (Signed)
Medical screening examination/treatment/procedure(s) were conducted as a shared visit with non-physician practitioner(s) and myself.  I personally evaluated the patient during the encounter  Alexander Lyons is a 79 y.o. male hx of dementia, CVA, failure to thrive here with dec PO intake. He has been eating but refuses to drink today. Recently admitted for renal failure and dehydration. Doesn't appear dehydrated today. Unable to give much history. Not febrile or tachycardic. Nonfocal neuro exam and oriented only to self. Labs at baseline. UA, CXR, CT head unremarkable. I think he can go back. I think he needs more skill nursing at the facility. He should get water in his food to increase fluid intake. Return precautions given.   Level V caveat- dementia    Richardean Canal, MD 06/14/12 2249

## 2012-06-14 NOTE — ED Provider Notes (Addendum)
History     CSN: [id number]  Arrival date & time 06/14/12  2100   First MD Initiated Contact with Patient 06/14/12 2100      Chief Complaint  Patient presents with  . Failure To Thrive    (Consider location/radiation/quality/duration/timing/severity/associated sxs/prior treatment) HPI Comments: Patient recently moved form skilled nursing to independent living with his wife.  For the past several days has been eating but refusing fluids.  Today states to a caregiver that he hurt all over but denies fall  The history is provided by a relative and the patient.    Past Medical History  Diagnosis Date  . HTN (hypertension)   . Dementia   . History of CVA (cerebrovascular accident)     no residual deficit (this apparently occured shortly after coming off of aspirin for cataract surgery.  Of note, old records show MRI head w/out contrast 07/2010 showed chronic small vessel infarctions w/in the pons + chronic small vessel dz throughout the brain.  . Atrial fibrillation   . Hearing impairment in [facility] 2    Bilateral; [facility] but he won't wear them  . Colon polyp 2002  . History of dizziness   . Elevated PSA 2012    PSA 28.13 Apr 2010: Urology eval (Dr. Cleone Slim)  . SCC (squamous cell carcinoma), leg 2010    left  . History of rib fracture 2006    4 on right; nondisplaced (s/p lawn mower fell on him)  . Vertebral compression fracture     30% anterior wedge compression deformity T11    Past Surgical History  Procedure Laterality Date  . Middle ear surgery      Cholesteatoma-?external ear surgery, too.    . Inguinal hernia repair      right  . Colonoscopy w/ polypectomy  2002  . Tonsillectomy      No family history on file.  History  Substance Use Topics  . Smoking status: Former Smoker    Types: Cigarettes    Quit date: 03/12/1968  . Smokeless tobacco: Never Used  . Alcohol Use: No      Review of Systems  Constitutional: Positive for appetite change. Negative  for activity change.  HENT: Negative for congestion, rhinorrhea and trouble swallowing.   Respiratory: Negative for cough.   Cardiovascular: Negative for chest pain.  Gastrointestinal: Negative for vomiting, abdominal pain, diarrhea and constipation.  Genitourinary: Negative for dysuria and flank pain.  Skin: Negative for color change.  Neurological: Negative for weakness and headaches.  All other systems reviewed and are negative.    Allergies  Review of patient's allergies indicates no known allergies.  Home Medications   Current Outpatient Rx  Name  Route  Sig  Dispense  Refill  . digoxin (LANOXIN) 0.125 MG tablet   Oral   Take 0.125 mg by mouth daily.          Marland Kitchen donepezil (ARICEPT) 10 MG tablet   Oral   Take 5 mg by mouth at bedtime.          Marland Kitchen warfarin (COUMADIN) 2.5 MG tablet   Oral   Take 1.25-2.5 mg by mouth daily. 2.5mg  on Sunday, Monday, Tuesday, Thursday, Friday 1.25mg  on Saturday and Wednesday           BP 193/89  Pulse 94  Temp(Src) 98.9 F (37.2 C) (Oral)  Resp 26  SpO2 100%  Physical Exam  Constitutional: He is oriented to person, place, and time. He appears well-developed and  well-nourished. No distress.  HENT:  Head: Normocephalic and atraumatic.  Mouth/Throat: Oropharynx is clear and moist.  Lips and mucous membranes moist   Eyes: Pupils are equal, round, and reactive to light.  Neck: Normal range of motion.  Cardiovascular: Normal rate and regular rhythm.   Abdominal: Soft. Bowel sounds are normal.  Musculoskeletal: Normal range of motion. He exhibits no edema and no tenderness.  Neurological: He is alert and oriented to person, place, and time.  Skin: Skin is warm.    ED Course  Procedures (including critical care time)  Labs Reviewed  CBC WITH DIFFERENTIAL - Abnormal; Notable for the following:    WBC 10.9 (*)    RDW 16.2 (*)    Monocytes Relative 14 (*)    Monocytes Absolute 1.5 (*)    All other components within normal  limits  COMPREHENSIVE METABOLIC PANEL - Abnormal; Notable for the following:    BUN 26 (*)    GFR calc non Af Amer 51 (*)    GFR calc Af Amer 59 (*)    All other components within normal limits  URINALYSIS, ROUTINE W REFLEX MICROSCOPIC - Abnormal; Notable for the following:    APPearance CLOUDY (*)    Protein, ur 30 (*)    Urobilinogen, UA 4.0 (*)    All other components within normal limits  PROTIME-INR - Abnormal; Notable for the following:    Prothrombin Time 22.7 (*)    INR 2.10 (*)    All other components within normal limits  URINE MICROSCOPIC-ADD ON - Abnormal; Notable for the following:    Squamous Epithelial / LPF FEW (*)    Bacteria, UA FEW (*)    All other components within normal limits  LIPASE, BLOOD   Dg Chest 2 View  06/14/2012  *RADIOLOGY REPORT*  Clinical Data: Altered mental status.  CHEST - 2 VIEW  Comparison: None.  Findings: The heart is enlarged.  There is a hiatal hernia.  Mildly prominent interstitial markings are identified.  There is probable mild interstitial edema.  Aorta is tortuous and partially calcified.  Small bilateral pleural effusions are present.  Note is made of a wedge compression fracture in the lower thoracic spine, likely T12.  The age is indeterminate.  There is mild wedging of other thoracic levels, the chronicity of which is not known.  IMPRESSION:  1.  Cardiomegaly and mild interstitial edema. 2.  Bilateral small pleural effusions. 3.  Wedge compression fracture of T12, age indeterminate.   Original Report Authenticated By: Norva Pavlov, M.D.    Ct Head Wo Contrast  06/14/2012  *RADIOLOGY REPORT*  Clinical Data: Decreased oral intake for 4 days.  No other complaints.  CT HEAD WITHOUT CONTRAST  Technique:  Contiguous axial images were obtained from the base of the skull through the vertex without contrast.  Comparison: None.  Findings: There is significant central cortical atrophy. Periventricular white matter changes are consistent with small  vessel disease. There is no evidence for hemorrhage, mass lesion, or acute infarction.  Bone windows show no calvarial fracture.  There is atherosclerotic calcification of the internal carotid arteries.  Mild mucoperiosteal thickening of the maxillary and ethmoid air cells.  IMPRESSION:  1.  Atrophy and small vessel disease. 2. No evidence for acute intracranial abnormality. 3.  Chronic sinusitis.   Original Report Authenticated By: Norva Pavlov, M.D.      1. Dementia       MDM   Son states that patient can be moved within the facility  to different levels of care as needed.  I suggest move back to skilled nursing increased fluids in food and being offered small frequent sips of fluids  The son is refusing to take the patient home but will sit int he hospital with his father for the night .       Arman Filter, NP 06/14/12 2243  Arman Filter, NP 06/15/12 1950

## 2012-06-14 NOTE — ED Notes (Signed)
Pt brought back from CXR but now going to head CT.

## 2012-06-14 NOTE — ED Notes (Signed)
Patient is resting [place]. Social worker called email the L2 Form for patient to return to [employer].

## 2012-06-14 NOTE — ED Notes (Signed)
Per EMS: Pt from [facility] with c/o decreased oral intake x 4 days. Denies any complaint, pain. AO at baseline. Pt recently seen for same.  170/70. 70 irr A fib. 98% RA.

## 2012-06-15 NOTE — Progress Notes (Signed)
CSW (on call) received call from Elsie Stain, NP that this 39 year of male- resident of [facility] was currently in the [facility] ED.  He has recently been a resident of their SNF unit but several days ago was sent back to his independent living at the facility.  She stated that per son- the facility can move him back into the SNF unit as he is not drinking or eating well and requires closer monitoring for Ms. Shultz.  CSW completed FL2 for SNF level and ER MD will sign.  Per NP, patient will not require hospitalization and she plans to return patient to his independent living arrangement tonight and then family can facilitate move to SNF tomorrow.  CSW will sign off.  Lorri Frederick. [place]  [phone]  .

## 2012-06-18 ENCOUNTER — Emergency Department (HOSPITAL_COMMUNITY): Payer: Medicare Other

## 2012-06-18 ENCOUNTER — Encounter (HOSPITAL_COMMUNITY): Payer: Self-pay | Admitting: Emergency Medicine

## 2012-06-18 ENCOUNTER — Inpatient Hospital Stay (HOSPITAL_COMMUNITY)
Admission: EM | Admit: 2012-06-18 | Discharge: 2012-06-21 | DRG: 470 | Disposition: A | Discharge status: Skilled Nursing Facility | Payer: Medicare Other | Attending: Internal Medicine | Admitting: Internal Medicine

## 2012-06-18 DIAGNOSIS — E876 Hypokalemia: Secondary | ICD-10-CM | POA: Diagnosis present

## 2012-06-18 DIAGNOSIS — R791 Abnormal coagulation profile: Secondary | ICD-10-CM | POA: Diagnosis present

## 2012-06-18 DIAGNOSIS — F0390 Unspecified dementia without behavioral disturbance: Secondary | ICD-10-CM

## 2012-06-18 DIAGNOSIS — Y998 Other external cause status: Secondary | ICD-10-CM

## 2012-06-18 DIAGNOSIS — I10 Essential (primary) hypertension: Secondary | ICD-10-CM | POA: Diagnosis present

## 2012-06-18 DIAGNOSIS — Z8673 Personal history of transient ischemic attack (TIA), and cerebral infarction without residual deficits: Secondary | ICD-10-CM

## 2012-06-18 DIAGNOSIS — S72002A Fracture of unspecified part of neck of left femur, initial encounter for closed fracture: Secondary | ICD-10-CM

## 2012-06-18 DIAGNOSIS — W19XXXA Unspecified fall, initial encounter: Secondary | ICD-10-CM | POA: Diagnosis present

## 2012-06-18 DIAGNOSIS — H9190 Unspecified hearing loss, unspecified ear: Secondary | ICD-10-CM | POA: Diagnosis present

## 2012-06-18 DIAGNOSIS — Y9210 Unspecified residential institution as the place of occurrence of the external cause: Secondary | ICD-10-CM | POA: Diagnosis present

## 2012-06-18 DIAGNOSIS — I4891 Unspecified atrial fibrillation: Secondary | ICD-10-CM

## 2012-06-18 DIAGNOSIS — Z85828 Personal history of other malignant neoplasm of skin: Secondary | ICD-10-CM

## 2012-06-18 DIAGNOSIS — D689 Coagulation defect, unspecified: Secondary | ICD-10-CM

## 2012-06-18 DIAGNOSIS — S72009A Fracture of unspecified part of neck of unspecified femur, initial encounter for closed fracture: Principal | ICD-10-CM

## 2012-06-18 DIAGNOSIS — S72002D Fracture of unspecified part of neck of left femur, subsequent encounter for closed fracture with routine healing: Secondary | ICD-10-CM

## 2012-06-18 LAB — PROTIME-INR
INR: 4.29 — ABNORMAL HIGH (ref 0.00–1.49)
INR: 1.69 — ABNORMAL HIGH (ref 0.00–1.49)
Prothrombin Time: 38.5 seconds — ABNORMAL HIGH (ref 11.6–15.2)
Prothrombin Time: 19.3 seconds — ABNORMAL HIGH (ref 11.6–15.2)

## 2012-06-18 LAB — URINALYSIS, ROUTINE W REFLEX MICROSCOPIC
Ketones, ur: 15 mg/dL — AB
Nitrite: NEGATIVE
pH: 5 (ref 5.0–8.0)

## 2012-06-18 LAB — DIGOXIN LEVEL: Digoxin Level: 0.8 ng/mL (ref 0.8–2.0)

## 2012-06-18 LAB — COMPREHENSIVE METABOLIC PANEL
AST: 16 U/L (ref 0–37)
Albumin: 2.9 g/dL — ABNORMAL LOW (ref 3.5–5.2)
Chloride: 104 mEq/L (ref 96–112)
Creatinine, Ser: 1.14 mg/dL (ref 0.50–1.35)
Sodium: 139 mEq/L (ref 135–145)
Total Bilirubin: 0.7 mg/dL (ref 0.3–1.2)

## 2012-06-18 LAB — CBC WITH DIFFERENTIAL/PLATELET
Basophils Absolute: 0 10*3/uL (ref 0.0–0.1)
Eosinophils Absolute: 0 10*3/uL (ref 0.0–0.7)
Eosinophils Absolute: 0 10*3/uL (ref 0.0–0.7)
Eosinophils Relative: 0 % (ref 0–5)
Hemoglobin: 12.1 g/dL — ABNORMAL LOW (ref 13.0–17.0)
Lymphocytes Relative: 10 % — ABNORMAL LOW (ref 12–46)
Lymphs Abs: 1.3 10*3/uL (ref 0.7–4.0)
MCH: 29.2 pg (ref 26.0–34.0)
MCHC: 34.7 g/dL (ref 30.0–36.0)
MCV: 82.9 fL (ref 78.0–100.0)
Monocytes Relative: 14 % — ABNORMAL HIGH (ref 3–12)
Neutro Abs: 12.4 10*3/uL — ABNORMAL HIGH (ref 1.7–7.7)
Neutrophils Relative %: 79 % — ABNORMAL HIGH (ref 43–77)
Platelets: 352 10*3/uL (ref 150–400)
Platelets: 350 10*3/uL (ref 150–400)
RBC: 4.14 MIL/uL — ABNORMAL LOW (ref 4.22–5.81)
RDW: 15.9 % — ABNORMAL HIGH (ref 11.5–15.5)

## 2012-06-18 LAB — URINE MICROSCOPIC-ADD ON

## 2012-06-18 LAB — BASIC METABOLIC PANEL
BUN: 28 mg/dL — ABNORMAL HIGH (ref 6–23)
Calcium: 8.9 mg/dL (ref 8.4–10.5)
Creatinine, Ser: 1.27 mg/dL (ref 0.50–1.35)
GFR calc Af Amer: 54 mL/min — ABNORMAL LOW (ref >90)
GFR calc non Af Amer: 47 mL/min — ABNORMAL LOW (ref >90)

## 2012-06-18 MED ORDER — MORPHINE SULFATE 2 MG/ML IJ SOLN: 0.5000 mg | INTRAMUSCULAR | Status: DC | PRN

## 2012-06-18 MED ORDER — HYDRALAZINE HCL 20 MG/ML IJ SOLN: 10.0000 mg | INTRAMUSCULAR | Status: DC | PRN

## 2012-06-18 MED ORDER — HYDRALAZINE HCL 20 MG/ML IJ SOLN
10.0000 mg | INTRAMUSCULAR | Status: DC | PRN
  Administered 2012-06-21: 10 mg via INTRAVENOUS
  Filled 2012-06-18: qty 1

## 2012-06-18 MED ORDER — DIGOXIN 125 MCG PO TABS
0.1250 mg | ORAL_TABLET | Freq: Every day | ORAL | Status: DC
  Administered 2012-06-18 – 2012-06-21 (×4): 0.125 mg via ORAL
  Filled 2012-06-18 (×4): qty 1

## 2012-06-18 MED ORDER — DONEPEZIL HCL 5 MG PO TABS
5.0000 mg | ORAL_TABLET | Freq: Every day | ORAL | Status: DC
  Administered 2012-06-18 – 2012-06-19 (×2): 5 mg via ORAL
  Filled 2012-06-18 (×3): qty 1

## 2012-06-18 MED ORDER — SODIUM CHLORIDE 0.9 % IV SOLN: INTRAVENOUS | Status: AC

## 2012-06-18 MED ORDER — HYDROCODONE-ACETAMINOPHEN 5-325 MG PO TABS: 1.0000 | ORAL_TABLET | Freq: Four times a day (QID) | ORAL | Status: DC | PRN

## 2012-06-18 MED ORDER — VITAMIN K1 10 MG/ML IJ SOLN
10.0000 mg | Freq: Once | INTRAVENOUS | Status: AC
  Administered 2012-06-18: 10 mg via INTRAVENOUS
  Filled 2012-06-18: qty 1

## 2012-06-18 NOTE — Consult Note (Signed)
Reason for Consult:  Left hip femoral neck fracture Referring Physician:   ED MD  Alexander Lyons is an 79 y.o. male.  HPI:   79 yo demented nursing home patient who fell while ambulating early this am.  With obvious discomfort and the inability to ambulate following his fall, he was brought to the [facility] ED and was found to have a left hip fracture.  Both Ortho and [facility] are consulted.  Past Medical History  Diagnosis Date  . HTN (hypertension)   . Dementia   . History of CVA (cerebrovascular accident)     no residual deficit (this apparently occured shortly after coming off of aspirin for cataract surgery.  Of note, old records show MRI head w/out contrast 07/2010 showed chronic small vessel infarctions w/in the pons + chronic small vessel dz throughout the brain.  . Atrial fibrillation   . Hearing impairment in [facility] 2    Bilateral; [facility] but [facility] won't wear them  . Colon polyp 2002  . History of dizziness   . Elevated PSA 2012    PSA 28.13 Apr 2010: Urology eval (Dr. Cleone Slim)  . SCC (squamous cell carcinoma), leg 2010    left  . History of rib fracture 2006    4 on right; nondisplaced (s/p lawn mower fell on him)  . Vertebral compression fracture     30% anterior wedge compression deformity T11    Past Surgical History  Procedure Laterality Date  . Middle ear surgery      Cholesteatoma-?external ear surgery, too.    . Inguinal hernia repair      right  . Colonoscopy w/ polypectomy  2002  . Tonsillectomy      History reviewed. No pertinent family history.  Social History:  reports that he quit smoking about 44 years ago. His smoking use included Cigarettes. He smoked 0.00 packs per day. He has never used smokeless tobacco. He reports that he does not drink alcohol or use illicit drugs.  Allergies: No Known Allergies  Medications: I have reviewed the patient's current medications.  Results for orders placed during the hospital encounter of  06/18/12 (from the past 48 hour(s))  BASIC METABOLIC PANEL     Status: Abnormal   Collection Time    06/18/12  4:01 AM      Result Value Range   Sodium 139  135 - 145 mEq/L   Potassium 3.2 (*) 3.5 - 5.1 mEq/L   Chloride 101  96 - 112 mEq/L   CO2 24  19 - 32 mEq/L   Glucose, Bld 140 (*) 70 - 99 mg/dL   BUN 28 (*) 6 - 23 mg/dL   Creatinine, Ser 8.29  0.50 - 1.35 mg/dL   Calcium 8.9  8.4 - 56.2 mg/dL   GFR calc non Af Amer 47 (*) >90 mL/min   GFR calc Af Amer 54 (*) >90 mL/min   Comment:            The eGFR has been calculated     using the CKD EPI equation.     This calculation has not been     validated in all clinical     situations.     eGFR's persistently     <90 mL/min signify     possible Chronic Kidney Disease.  CBC WITH DIFFERENTIAL     Status: Abnormal   Collection Time    06/18/12  4:01 AM      Result  Value Range   WBC 15.7 (*) 4.0 - 10.5 K/uL   RBC 4.37  4.22 - 5.81 MIL/uL   Hemoglobin 12.6 (*) 13.0 - 17.0 g/dL   HCT 13.2 (*) 44.0 - 10.2 %   MCV 83.1  78.0 - 100.0 fL   MCH 28.8  26.0 - 34.0 pg   MCHC 34.7  30.0 - 36.0 g/dL   RDW 72.5 (*) 36.6 - 44.0 %   Platelets 352  150 - 400 K/uL   Neutrophils Relative 79 (*) 43 - 77 %   Lymphocytes Relative 10 (*) 12 - 46 %   Monocytes Relative 11  3 - 12 %   Eosinophils Relative 0  0 - 5 %   Basophils Relative 0  0 - 1 %   Neutro Abs 12.4 (*) 1.7 - 7.7 K/uL   Lymphs Abs 1.6  0.7 - 4.0 K/uL   Monocytes Absolute 1.7 (*) 0.1 - 1.0 K/uL   Eosinophils Absolute 0.0  0.0 - 0.7 K/uL   Basophils Absolute 0.0  0.0 - 0.1 K/uL  PROTIME-INR     Status: Abnormal   Collection Time    06/18/12  5:11 AM      Result Value Range   Prothrombin Time 38.5 (*) 11.6 - 15.2 seconds   INR 4.29 (*) 0.00 - 1.49  DIGOXIN LEVEL     Status: None   Collection Time    06/18/12  5:11 AM      Result Value Range   Digoxin Level 0.8  0.8 - 2.0 ng/mL  URINALYSIS, ROUTINE W REFLEX MICROSCOPIC     Status: Abnormal   Collection Time    06/18/12  6:48  AM      Result Value Range   Color, Urine AMBER (*) YELLOW   Comment: BIOCHEMICALS MAY BE AFFECTED BY COLOR   APPearance CLOUDY (*) CLEAR   Specific Gravity, Urine 1.029  1.005 - 1.030   pH 5.0  5.0 - 8.0   Glucose, UA NEGATIVE  NEGATIVE mg/dL   Hgb urine dipstick MODERATE (*) NEGATIVE   Bilirubin Urine SMALL (*) NEGATIVE   Ketones, ur 15 (*) NEGATIVE mg/dL   Protein, ur 347 (*) NEGATIVE mg/dL   Urobilinogen, UA 1.0  0.0 - 1.0 mg/dL   Nitrite NEGATIVE  NEGATIVE   Leukocytes, UA NEGATIVE  NEGATIVE  URINE MICROSCOPIC-ADD ON     Status: Abnormal   Collection Time    06/18/12  6:48 AM      Result Value Range   WBC, UA 0-2  <3 WBC/hpf   RBC / HPF 3-6  <3 RBC/hpf   Casts GRANULAR CAST (*) NEGATIVE   Urine-Other MUCOUS PRESENT     Comment: AMORPHOUS URATES/PHOSPHATES    Dg Chest 1 View  06/18/2012  *RADIOLOGY REPORT*  Clinical Data: Status post fall; concern for chest injury.  CHEST - 1 VIEW  Comparison: Chest radiograph performed 06/14/2012  Findings: The lungs are well-aerated.  Mild bibasilar airspace opacities could reflect mild interstitial edema; vascular congestion has improved.  There is mild elevation of the right hemidiaphragm.  No definite pleural effusion or pneumothorax is seen.  The cardiomediastinal silhouette is enlarged; calcification is noted within the aortic arch.  No acute osseous abnormalities are seen.  IMPRESSION:  1.  Mild bibasilar airspace opacities could reflect mild interstitial edema; vascular congestion has improved. 2.  Cardiomegaly noted.   Original Report Authenticated By: Tonia Ghent, M.D.    Dg Hip Complete Left  06/18/2012  *RADIOLOGY REPORT*  Clinical Data: Status post fall; left hip pain.  LEFT HIP - COMPLETE 2+ VIEW  Comparison: None.  Findings: There is a mildly displaced subcapital fracture through the left femoral neck.  No additional fractures are seen.  The right hip is unremarkable in appearance.  No significant degenerative change is appreciated.   The sacroiliac joints are unremarkable in appearance.  The visualized bowel gas pattern is grossly unremarkable in appearance.  Scattered vascular calcifications are seen.  IMPRESSION:  1.  Mildly displaced subcapital fracture through the left femoral neck. 2.  Scattered vascular calcifications seen.   Original Report Authenticated By: Tonia Ghent, M.D.    Dg Femur Left  06/18/2012  *RADIOLOGY REPORT*  Clinical Data: Status post fall; left hip pain.  LEFT FEMUR - 2 VIEW  Comparison: None.  Findings: There is a displaced subcapital left femoral neck fracture.  The left femoral head remains seated at the acetabulum. No additional fractures are seen.  The degree of displacement varies depending on positioning.  The remainder of the left femur appears intact.  An enthesophyte is noted arising at the superior pole of the patella.  Scattered vascular calcifications are seen.  No additional soft tissue abnormalities are characterized.  IMPRESSION:  1.  Displaced subcapital left femoral neck fracture noted. 2.  Scattered vascular calcifications seen.   Original Report Authenticated By: Tonia Ghent, M.D.    Ct Head Wo Contrast  06/18/2012  *RADIOLOGY REPORT*  Clinical Data: Unwitnessed fall; concern for head injury.  Patient on Coumadin.  CT HEAD WITHOUT CONTRAST  Technique:  Contiguous axial images were obtained from the base of the skull through the vertex without contrast.  Comparison: CT of the head performed 06/14/2012, and MRI of the brain performed 07/12/2010  Findings: There is no evidence of acute infarction, mass lesion, or intra- or extra-axial hemorrhage on CT.  Prominence of the ventricles and sulci reflects moderate cortical volume loss.  Cerebellar atrophy is noted.  Diffuse periventricular and subcortical white matter change likely reflects small vessel ischemic microangiopathy.  The brainstem and fourth ventricle are within normal limits.  The basal ganglia are unremarkable in appearance.  The  cerebral hemispheres demonstrate grossly normal gray-white differentiation. No mass effect or midline shift is seen.  There is no evidence of fracture; visualized osseous structures are unremarkable in appearance.  The orbits are within normal limits. There is partial opacification of the left maxillary sinus; the patient is status post left maxillary antrostomy and left-sided mastoidectomy.  The remaining paranasal sinuses and right mastoid air cells are well-aerated.  No significant soft tissue abnormalities are seen.  IMPRESSION:  1.  No evidence of traumatic intracranial injury or fracture. 2.  Moderate cortical volume loss and diffuse small vessel ischemic microangiopathy. 3.  Partial opacification of the left maxillary sinus.   Original Report Authenticated By: Tonia Ghent, M.D.     ROS Blood pressure 176/91, pulse 83, temperature 98.8 F (37.1 C), temperature source Oral, resp. rate 18, SpO2 99.00%. Physical Exam  Musculoskeletal:       Left hip: He exhibits decreased range of motion, tenderness and deformity.  Neurological: He is alert.  He is confused and not oriented to person and place. He does respond to pain when I try to move his left hip. His left foot is perfused. Does not appear to have any other long bone deficits on palpation and ispection  Assessment/Plan: Displaced left hip femoral neck fracture in a demented 79 yo nursing home resident who does ambulate. 1)  I will need to discuss with his son the families wishes.  He does ambulate and is in pain when you attempt to move him.  Certainly there are quality of life issues.  He is on coumadin and is supra-therapeutic with his INR, so surgery would not be for today. I will give a dose of vitamine K today and may plan on surgery tomorrow vs Friday pending his medical status and families wishes.  Reet Scharrer Y 06/18/2012, 7:18 AM

## 2012-06-18 NOTE — Evaluation (Signed)
Clinical/Bedside Swallow Evaluation Patient Details  Name: Alexander Lyons MRN: [id number] Date of Birth: 1916-06-13  Today's Date: 06/18/2012 Time: 1102-     Past Medical History:  Past Medical History  Diagnosis Date  . HTN (hypertension)   . Dementia   . History of CVA (cerebrovascular accident)     no residual deficit (this apparently occured shortly after coming off of aspirin for cataract surgery.  Of note, old records show MRI head w/out contrast 07/2010 showed chronic small vessel infarctions w/in the pons + chronic small vessel dz throughout the brain.  . Atrial fibrillation   . Hearing impairment in [facility] 2    Bilateral; [facility] but [facility] won't wear them  . Colon polyp 2002  . History of dizziness   . Elevated PSA 2012    PSA 28.13 Apr 2010: Urology eval (Dr. Cleone Slim)  . SCC (squamous cell carcinoma), leg 2010    left  . History of rib fracture 2006    4 on right; nondisplaced (s/p lawn mower fell on him)  . Vertebral compression fracture     30% anterior wedge compression deformity T11   Past Surgical History:  Past Surgical History  Procedure Laterality Date  . Middle ear surgery      Cholesteatoma-?external ear surgery, too.    . Inguinal hernia repair      right  . Colonoscopy w/ polypectomy  2002  . Tonsillectomy     HPI:  79 year old resident of SNF with dementia admitted with Displaced left hip femoral neck fracture    Assessment / Plan / Recommendation Clinical Impression  Patient presents with a functional appearing oropharyngeal swallow. Mastication of regular texture solids mildly prolonged however full oral clearance noted independently given extra time. Vocal quality rough however remained at baseline during po trials. Cannot r/o component of esophageal dysfunction given frequent belching which would not be uncommon for advanced age which does also increase aspiration risk. Overall, patient appears appropriate to resume a regular diet, thin liquid  with general safe swallowing precautions. No SLP f/u indicated however if patient is to undergo surgery, may present with increased risk factors for aspiration post op with need for anesthesia. May wish to reconsult SLPs for bedside swallow evaluation post op.     Aspiration Risk  Mild    Diet Recommendation Regular;Thin liquid   Liquid Administration via: Cup;Straw Medication Administration: Whole meds with liquid Supervision: Patient able to self feed;Intermittent supervision to cue for compensatory strategies Compensations: Slow rate;Small sips/bites Postural Changes and/or Swallow Maneuvers: Seated upright 90 degrees;Upright 30-60 min after meal    Other  Recommendations Oral Care Recommendations: Oral care BID   Follow Up Recommendations  None       Pertinent Vitals/Pain None noted        Swallow Study    General HPI: 79 year old resident of SNF with dementia admitted with Displaced left hip femoral neck fracture  Type of Study: Bedside swallow evaluation Previous Swallow Assessment: none noted Diet Prior to this Study: NPO Temperature Spikes Noted: No Respiratory Status: Room air History of Recent Intubation: No Behavior/Cognition: Alert;Cooperative;Pleasant mood;Confused Oral Cavity - Dentition: Adequate natural dentition Self-Feeding Abilities: Able to feed self Patient Positioning: Upright in bed Baseline Vocal Quality:  (rough) Volitional Cough: Strong Volitional Swallow: Able to elicit    Oral/Motor/Sensory Function Overall Oral Motor/Sensory Function: Appears within functional limits for tasks assessed   Ice Chips Ice chips: Not tested   Thin Liquid Thin  Liquid: Within functional limits Presentation: Cup;Straw    Nectar Thick Nectar Thick Liquid: Not tested   Honey Thick Honey Thick Liquid: Not tested   Puree Puree: Within functional limits (except belching) Presentation: Self Fed;Spoon   Solid   GO   Alexander Lyons, Alexander Lyons [phone]  Solid:  Within functional limits Presentation: Self Fed       Alexander Lyons 06/18/2012,11:22 AM

## 2012-06-18 NOTE — ED Notes (Signed)
Attempted to call report, RN not available, advised they would call back for report.

## 2012-06-18 NOTE — Progress Notes (Signed)
INITIAL NUTRITION ASSESSMENT  DOCUMENTATION CODES Per approved criteria  -Severe malnutrition in the context of chronic illness -Underweight   INTERVENTION:  Advance diet as medically appropriate RD to follow for nutrition care plan, add interventions accordinlgy  NUTRITION DIAGNOSIS: Increased nutrient needs related to hip fracture healing as evidenced by estimated nutrition needs  Goal: Oral intake with meals & supplements to meet >/= 90% of estimated nutrition needs  Monitor:  PO diet advancement & intake, weight, labs, I/O's  Reason for Assessment: Consult ---> Hip Fracture Protocol  79 y.o. male  Admitting Dx: Closed left hip fracture  ASSESSMENT: Patient with hx of advanced dementia who was recently admitted for dehydration and hypotension was brought to the ER after patient was complaining of persistent pain in the left hip area ---> X-ray revealed left hip fracture.  RD assessment completed 05/23/12 during previous hospitalization ---> patient admitted due to renal failure, dehydration & decreased oral intake; s/p bedside swallow evaluation this AM; patient with visible fat loss (upper arm) & muscle wasting (temples, clavicles); would benefit from addition of nutrition supplements; RD to order when able.   Pt meets criteria for severe malnutrition in the context of chronic illness given < 75% intake of estimated energy requirement for > 1 month, severe subcutaneous fat loss & muscle loss.  Height: Ht Readings from Last 1 Encounters:  05/22/12 6' (1.829 m)    Weight: Wt Readings from Last 1 Encounters:  05/26/12 117 lb 11.6 oz (53.4 kg)    Ideal Body Weight: 178 lb  % Ideal Body Weight: 66%  Wt Readings from Last 10 Encounters:  05/26/12 117 lb 11.6 oz (53.4 kg)  05/02/12 123 lb (55.792 kg)  01/25/12 125 lb (56.7 kg)    Usual Body Weight: 123 lb  % Usual Body Weight: 95%  BMI:  16.0 kg/m2  Estimated Nutritional Needs: Kcal: 1600-1800 Protein: 70-80  gm Fluid: 1.6-1.8 L  Skin: Intact  Diet Order: NPO  EDUCATION NEEDS: -No education needs identified at this time  Last BM: 4/8  Labs:   Recent Labs Lab 06/14/12 2125 06/18/12 0401 06/18/12 0900  NA 136 139 139  K 4.6 3.2* 3.0*  CL 104 101 104  CO2 21 24 24   BUN 26* 28* 27*  CREATININE 1.18 1.27 1.14  CALCIUM 9.1 8.9 8.5  GLUCOSE 99 140* 128*    Scheduled Meds: . digoxin  0.125 mg Oral Daily  . donepezil  5 mg Oral QHS    Continuous Infusions: . sodium chloride      Past Medical History  Diagnosis Date  . HTN (hypertension)   . Dementia   . History of CVA (cerebrovascular accident)     no residual deficit (this apparently occured shortly after coming off of aspirin for cataract surgery.  Of note, old records show MRI head w/out contrast 07/2010 showed chronic small vessel infarctions w/in the pons + chronic small vessel dz throughout the brain.  . Atrial fibrillation   . [facility] in [facility] 2    Bilateral; [facility] but [facility] won't wear them  . Colon polyp 2002  . History of dizziness   . Elevated PSA 2012    PSA 28.13 Apr 2010: Urology eval (Dr. Cleone Slim)  . SCC (squamous cell carcinoma), leg 2010    left  . History of rib fracture 2006    4 on right; nondisplaced (s/p lawn mower fell on him)  . Vertebral compression fracture     30% anterior wedge  compression deformity T11    Past Surgical History  Procedure Laterality Date  . Middle ear surgery      Cholesteatoma-?external ear surgery, too.    . Inguinal hernia repair      right  . Colonoscopy w/ polypectomy  2002  . Tonsillectomy      Maureen Chatters, RD, LDN Pager #: [phone] After-Hours Pager #: [phone]

## 2012-06-18 NOTE — Progress Notes (Signed)
Orthopedic Tech Progress Note Patient Details:  Alexander Lyons 11/01/18 [id number] Patient unable to use overhead frame, has mitts on hands. Patient ID: Alexander Lyons, male   DOB: 06/24/1916, 79 y.o.   MRN: [id number]   [name] 06/18/2012, 3:18 PM

## 2012-06-18 NOTE — ED Provider Notes (Signed)
History     CSN: [id number]  Arrival date & time 06/18/12  0241   First MD Initiated Contact with Patient 06/18/12 0315      Chief Complaint  Patient presents with  . Fall  . Leg Pain   Level V caveat for dementia  HPI Alexander Lyons is a 79 y.o. male presents after unwitnessed fall yesterday from [facility] skilled nursing facility. Patient does have a history of atrial fibrillation on Coumadin therapy, patient also has a history of dementia. Patient is very poor historians complains about some left mid thigh pain. He says he is unable to walk. He denies any chest pain, shortness of breath, headache, head pain, neck pain, changes in vision, paresthesias weakness or numbness.   Past Medical History  Diagnosis Date  . HTN (hypertension)   . Dementia   . History of CVA (cerebrovascular accident)     no residual deficit (this apparently occured shortly after coming off of aspirin for cataract surgery.  Of note, old records show MRI head w/out contrast 07/2010 showed chronic small vessel infarctions w/in the pons + chronic small vessel dz throughout the brain.  . Atrial fibrillation   . [facility] impairment in [facility]    [facility]; [facility] but [facility] won't [facility] them  . Colon polyp 2002  . History of dizziness   . Elevated PSA 2012    PSA 28.13 Apr 2010: Urology eval (Dr. Cleone Slim)  . SCC (squamous cell carcinoma), leg 2010    left  . History of rib fracture 2006    4 on right; nondisplaced (s/p lawn mower fell on him)  . Vertebral compression fracture     30% anterior wedge compression deformity T11    Past Surgical History  Procedure Laterality Date  . Middle ear surgery      Cholesteatoma-?external ear surgery, too.    . Inguinal hernia repair      right  . Colonoscopy w/ polypectomy  2002  . Tonsillectomy      No family history on file.  History  Substance Use Topics  . Smoking status: Former Smoker    Types: Cigarettes    Quit date: 03/12/1968  .  Smokeless tobacco: Never Used  . Alcohol Use: No      Review of Systems Level V caveat for dementia Allergies  Review of patient's allergies indicates no known allergies.  Home Medications   Current Outpatient Rx  Name  Route  Sig  Dispense  Refill  . digoxin (LANOXIN) 0.125 MG tablet   Oral   Take 0.125 mg by mouth daily.          Marland Kitchen donepezil (ARICEPT) 10 MG tablet   Oral   Take 5 mg by mouth at bedtime.          Marland Kitchen warfarin (COUMADIN) 2.5 MG tablet   Oral   Take 1.25-2.5 mg by mouth daily. 2.5mg  on Sunday, Monday, Tuesday, Thursday, Friday 1.25mg  on Saturday and Wednesday           BP 177/95  Pulse 93  Temp(Src) 98.8 F (37.1 C) (Oral)  Resp 18  SpO2 96%  Physical Exam  Nursing notes reviewed.  Electronic medical record reviewed. VITAL SIGNS:   Filed Vitals:   06/18/12 0500 06/18/12 0700 06/18/12 0800 06/18/12 0858  BP: 176/91 147/85 156/81 162/91  Pulse: 83 76 76 92  Temp:    98.8 F (37.1 C)  TempSrc:      Resp: 18  20 28 18   SpO2: 99% 99% 98% 100%   CONSTITUTIONAL: Awake, oriented to self, appears non-toxic HENT: Atraumatic, normocephalic, oral mucosa pink and moist, airway patent. Nares patent without drainage. External ears normal. EYES: Conjunctiva clear, EOMI, PERRLA NECK: Trachea midline, non-tender, supple CARDIOVASCULAR: Irregularly irregular, No murmurs, rubs, gallops PULMONARY/CHEST: Clear to auscultation, no rhonchi, wheezes, or rales. Symmetrical breath sounds. Non-tender. ABDOMINAL: Non-distended, soft, non-tender - no rebound or guarding.  BS normal. NEUROLOGIC: Non-focal, moving all four extremities, no gross sensory or motor deficits. EXTREMITIES: No clubbing, cyanosis, or edema. Left leg shorter held external rotation - tenderness when anterior posterior pressure applied to the left greater trochanter. Neurovascular intact distally to the left lower extremity SKIN: Warm, Dry, No erythema, No rash  ED Course  Procedures (including  critical care time)  Date: 06/18/2012  Rate: 99  Rhythm: Atrial fibrillation  QRS Axis: normal  Intervals: normal  ST/T Wave abnormalities: normal  Conduction Disutrbances: none  Narrative Interpretation: Atrial fibrillation - no significant changes when compared with prior EKG dated 05/22/2012   Labs Reviewed  BASIC METABOLIC PANEL - Abnormal; Notable for the following:    Potassium 3.2 (*)    Glucose, Bld 140 (*)    BUN 28 (*)    GFR calc non Af Amer 47 (*)    GFR calc Af Amer 54 (*)    All other components within normal limits  CBC WITH DIFFERENTIAL - Abnormal; Notable for the following:    WBC 15.7 (*)    Hemoglobin 12.6 (*)    HCT 36.3 (*)    RDW 15.9 (*)    Neutrophils Relative 79 (*)    Lymphocytes Relative 10 (*)    Neutro Abs 12.4 (*)    Monocytes Absolute 1.7 (*)    All other components within normal limits  PROTIME-INR - Abnormal; Notable for the following:    Prothrombin Time 38.5 (*)    INR 4.29 (*)    All other components within normal limits  URINALYSIS, ROUTINE W REFLEX MICROSCOPIC - Abnormal; Notable for the following:    Color, Urine AMBER (*)    APPearance CLOUDY (*)    Hgb urine dipstick MODERATE (*)    Bilirubin Urine SMALL (*)    Ketones, ur 15 (*)    Protein, ur 100 (*)    All other components within normal limits  URINE MICROSCOPIC-ADD ON - Abnormal; Notable for the following:    Casts GRANULAR CAST (*)    All other components within normal limits  CBC WITH DIFFERENTIAL - Abnormal; Notable for the following:    WBC 14.5 (*)    RBC 4.14 (*)    Hemoglobin 12.1 (*)    HCT 34.3 (*)    RDW 16.1 (*)    Neutro Abs 11.1 (*)    Lymphocytes Relative 9 (*)    Monocytes Relative 14 (*)    Monocytes Absolute 2.0 (*)    All other components within normal limits  URINE CULTURE  DIGOXIN LEVEL  PROTIME-INR  COMPREHENSIVE METABOLIC PANEL  CBC WITH DIFFERENTIAL  TYPE AND SCREEN   Dg Chest 1 View  06/18/2012  *RADIOLOGY REPORT*  Clinical Data: Status  post fall; concern for chest injury.  CHEST - 1 VIEW  Comparison: Chest radiograph performed 06/14/2012  Findings: The lungs are well-aerated.  Mild bibasilar airspace opacities could reflect mild interstitial edema; vascular congestion has improved.  There is mild elevation of the right hemidiaphragm.  No definite pleural effusion or pneumothorax is seen.  The cardiomediastinal silhouette is enlarged; calcification is noted within the aortic arch.  No acute osseous abnormalities are seen.  IMPRESSION:  1.  Mild bibasilar airspace opacities could reflect mild interstitial edema; vascular congestion has improved. 2.  Cardiomegaly noted.   Original Report Authenticated By: Tonia Ghent, M.D.    Dg Hip Complete Left  06/18/2012  *RADIOLOGY REPORT*  Clinical Data: Status post fall; left hip pain.  LEFT HIP - COMPLETE 2+ VIEW  Comparison: None.  Findings: There is a mildly displaced subcapital fracture through the left femoral neck.  No additional fractures are seen.  The right hip is unremarkable in appearance.  No significant degenerative change is appreciated.  The sacroiliac joints are unremarkable in appearance.  The visualized bowel gas pattern is grossly unremarkable in appearance.  Scattered vascular calcifications are seen.  IMPRESSION:  1.  Mildly displaced subcapital fracture through the left femoral neck. 2.  Scattered vascular calcifications seen.   Original Report Authenticated By: Tonia Ghent, M.D.    Dg Femur Left  06/18/2012  *RADIOLOGY REPORT*  Clinical Data: Status post fall; left hip pain.  LEFT FEMUR - 2 VIEW  Comparison: None.  Findings: There is a displaced subcapital left femoral neck fracture.  The left femoral head remains seated at the acetabulum. No additional fractures are seen.  The degree of displacement varies depending on positioning.  The remainder of the left femur appears intact.  An enthesophyte is noted arising at the superior pole of the patella.  Scattered vascular  calcifications are seen.  No additional soft tissue abnormalities are characterized.  IMPRESSION:  1.  Displaced subcapital left femoral neck fracture noted. 2.  Scattered vascular calcifications seen.   Original Report Authenticated By: Tonia Ghent, M.D.    Ct Head Wo Contrast  06/18/2012  *RADIOLOGY REPORT*  Clinical Data: Unwitnessed fall; concern for head injury.  Patient on Coumadin.  CT HEAD WITHOUT CONTRAST  Technique:  Contiguous axial images were obtained from the base of the skull through the vertex without contrast.  Comparison: CT of the head performed 06/14/2012, and MRI of the brain performed 07/12/2010  Findings: There is no evidence of acute infarction, mass lesion, or intra- or extra-axial hemorrhage on CT.  Prominence of the ventricles and sulci reflects moderate cortical volume loss.  Cerebellar atrophy is noted.  Diffuse periventricular and subcortical white matter change likely reflects small vessel ischemic microangiopathy.  The brainstem and fourth ventricle are within normal limits.  The basal ganglia are unremarkable in appearance.  The cerebral hemispheres demonstrate grossly normal gray-white differentiation. No mass effect or midline shift is seen.  There is no evidence of fracture; visualized osseous structures are unremarkable in appearance.  The orbits are within normal limits. There is partial opacification of the left maxillary sinus; the patient is status post left maxillary antrostomy and left-sided mastoidectomy.  The remaining paranasal sinuses and right mastoid air cells are well-aerated.  No significant soft tissue abnormalities are seen.  IMPRESSION:  1.  No evidence of traumatic intracranial injury or fracture. 2.  Moderate cortical volume loss and diffuse small vessel ischemic microangiopathy. 3.  Partial opacification of the left maxillary sinus.   Original Report Authenticated By: Tonia Ghent, M.D.      1. Femoral neck fracture, left, closed, initial encounter    2. Atrial fibrillation   3. Coumadin resistance   4. Fall, initial encounter   5. A-fib   6. Dementia       MDM  Patient appears comfortable and  does not want any pain medicine at this time. He is only having pain when the left hip is palpated or when he attempts to walk.  X-ray of the left hip shows a left femoral neck fracture.  Discussed with Dr. Magnus Ivan for orthopedic consult and with [facility] hospitalist Dr. Toniann Fail for admission.  Patient fell yesterday and appears to be normal, there are no signs of head trauma, CT of head is negative. Patient is subtherapeutic on his INR, there is no evidence of hematoma in the left inguinal region, we'll not aggressively treat anticoagulation at this time.  Patient admitted stable      Jones Skene, MD 06/18/12 [id number]

## 2012-06-18 NOTE — Progress Notes (Signed)
Patient seen and examined this morning.  admission H&P reviewed. INR supratheraputic. Coumadin held and give a dose of vitamin k  Dr Magnus Ivan involved in care. Will discuss with family regarding surgery of his left femoral neck fracture. Will plan on surgery later this week if family agree. Vitals stable. Has significant dementia. Continue hydration. Will order diet for now. NPO after midnight if OR tomorrow. Recheck INR in am.

## 2012-06-18 NOTE — ED Notes (Signed)
Per ems- pt from [facility]. Staff reports pt fell yesterday. unwittnessed fall in bathroom. Since then pt has not been able to move around like he normally does (is normally ambulatory). Pt in pain to Left leg (mid thigh), especially with movement. No obvious deformities. Distal pulses present.

## 2012-06-18 NOTE — H&P (Signed)
[facility] History and Physical  Alexander Lyons NWG:[id number] DOB: June 10, 1916 DOA: 06/18/2012  Referring physician: Dr.Bonk. PCP: Jeoffrey Massed, MD  Specialists: None.  Chief Complaint: Fall with left hip pain.  HPI: Alexander Lyons is a 79 y.o. male with history of advanced dementia, atrial fibrillation, hypertension off medications who was recently admitted for dehydration and hypotension was brought to the ER after patient was complaining of persistent pain in the left hip area. As per patient's son with whom I spoke over the phone, patient had a fall when he was trying to go to the bathroom in the nursing home. He did not lose consciousness. Initially complain of pain and later on when he was placed on the bed he looked comfortable. Later he started complaining of pain and he was brought to the ER. X-rays revealed left hip fracture and at this time has been admitted for further management.  Review of Systems: As presented in the history of presenting illness, rest negative.  Past Medical History  Diagnosis Date  . HTN (hypertension)   . Dementia   . History of CVA (cerebrovascular accident)     no residual deficit (this apparently occured shortly after coming off of aspirin for cataract surgery.  Of note, old records show MRI head w/out contrast 07/2010 showed chronic small vessel infarctions w/in the pons + chronic small vessel dz throughout the brain.  . Atrial fibrillation   . [facility] impairment in [facility]    [facility]ilateral; [facility] but [facility] won't wear them  . Colon polyp 2002  . History of dizziness   . Elevated PSA 2012    PSA 28.13 Apr 2010: Urology eval (Dr. Cleone Slim)  . SCC (squamous cell carcinoma), leg 2010    left  . History of rib fracture 2006    4 on right; nondisplaced (s/p lawn mower fell on him)  . Vertebral compression fracture     30% anterior wedge compression deformity T11   Past Surgical History  Procedure Laterality Date  . Middle ear  surgery      Cholesteatoma-?external ear surgery, too.    . Inguinal hernia repair      right  . Colonoscopy w/ polypectomy  2002  . Tonsillectomy     Social History:  reports that he quit smoking about 44 years ago. His smoking use included Cigarettes. He smoked 0.00 packs per day. He has never used smokeless tobacco. He reports that he does not drink alcohol or use illicit drugs. Lives at nursing home. where does patient live-- Cannot do ADLs. Can patient participate in ADLs?  No Known Allergies  History reviewed. No pertinent family history.    Prior to Admission medications   Medication Sig Start Date End Date Taking? Authorizing Provider  digoxin (LANOXIN) 0.125 MG tablet Take 0.125 mg by mouth daily.  01/22/12   Historical Provider, MD  donepezil (ARICEPT) 10 MG tablet Take 5 mg by mouth at bedtime.  01/23/12   Historical Provider, MD  warfarin (COUMADIN) 2.5 MG tablet Take 1.25-2.5 mg by mouth daily. 2.5mg  on Sunday, Monday, Tuesday, Thursday, Friday 1.25mg  on Saturday and Wednesday 01/22/12   Historical Provider, MD   Physical Exam: Filed Vitals:   06/18/12 0244 06/18/12 0500  BP: 177/95 176/91  Pulse: 93 83  Temp: 98.8 F (37.1 C)   TempSrc: Oral   Resp: 18 18  SpO2: 96% 99%     General:   Well-developed well-nourished.  Eyes: Anicteric no pallor.  ENT:  No discharge from the ears eyes nose and mouth.  Neck: No mass felt.  Cardiovascular: S1-S2 heard.  Respiratory: No rhonchi or crepitations.  Abdomen: Soft nontender bowel sounds present.  Skin: No rash.  Musculoskeletal: Pain on moving left hip.  Psychiatric: Alert awake. Oriented to his name only.  Neurologic: Demented. Does not follow commands.  Labs on Admission:  Basic Metabolic Panel:  Recent Labs Lab 06/14/12 2125 06/18/12 0401  NA 136 139  K 4.6 3.2*  CL 104 101  CO2 21 24  GLUCOSE 99 140*  BUN 26* 28*  CREATININE 1.18 1.27  CALCIUM 9.1 8.9   Liver Function Tests:  Recent  Labs Lab 06/14/12 2125  AST 21  ALT 8  ALKPHOS 47  BILITOT 0.7  PROT 8.2  ALBUMIN 3.5    Recent Labs Lab 06/14/12 2125  LIPASE 26   No results found for this basename: AMMONIA,  in the last 168 hours CBC:  Recent Labs Lab 06/14/12 2125 06/18/12 0401  WBC 10.9* 15.7*  NEUTROABS 7.7 12.4*  HGB 13.8 12.6*  HCT 39.3 36.3*  MCV 85.4 83.1  PLT 369 352   Cardiac Enzymes: No results found for this basename: CKTOTAL, CKMB, CKMBINDEX, TROPONINI,  in the last 168 hours  BNP (last 3 results) No results found for this basename: PROBNP,  in the last 8760 hours CBG: No results found for this basename: GLUCAP,  in the last 168 hours  Radiological Exams on Admission: Dg Chest 1 View  06/18/2012  *RADIOLOGY REPORT*  Clinical Data: Status post fall; concern for chest injury.  CHEST - 1 VIEW  Comparison: Chest radiograph performed 06/14/2012  Findings: The lungs are well-aerated.  Mild bibasilar airspace opacities could reflect mild interstitial edema; vascular congestion has improved.  There is mild elevation of the right hemidiaphragm.  No definite pleural effusion or pneumothorax is seen.  The cardiomediastinal silhouette is enlarged; calcification is noted within the aortic arch.  No acute osseous abnormalities are seen.  IMPRESSION:  1.  Mild bibasilar airspace opacities could reflect mild interstitial edema; vascular congestion has improved. 2.  Cardiomegaly noted.   Original Report Authenticated By: Tonia Ghent, M.D.    Dg Hip Complete Left  06/18/2012  *RADIOLOGY REPORT*  Clinical Data: Status post fall; left hip pain.  LEFT HIP - COMPLETE 2+ VIEW  Comparison: None.  Findings: There is a mildly displaced subcapital fracture through the left femoral neck.  No additional fractures are seen.  The right hip is unremarkable in appearance.  No significant degenerative change is appreciated.  The sacroiliac joints are unremarkable in appearance.  The visualized bowel gas pattern is grossly  unremarkable in appearance.  Scattered vascular calcifications are seen.  IMPRESSION:  1.  Mildly displaced subcapital fracture through the left femoral neck. 2.  Scattered vascular calcifications seen.   Original Report Authenticated By: Tonia Ghent, M.D.    Dg Femur Left  06/18/2012  *RADIOLOGY REPORT*  Clinical Data: Status post fall; left hip pain.  LEFT FEMUR - 2 VIEW  Comparison: None.  Findings: There is a displaced subcapital left femoral neck fracture.  The left femoral head remains seated at the acetabulum. No additional fractures are seen.  The degree of displacement varies depending on positioning.  The remainder of the left femur appears intact.  An enthesophyte is noted arising at the superior pole of the patella.  Scattered vascular calcifications are seen.  No additional soft tissue abnormalities are characterized.  IMPRESSION:  1.  Displaced subcapital left  femoral neck fracture noted. 2.  Scattered vascular calcifications seen.   Original Report Authenticated By: Tonia Ghent, M.D.    Ct Head Wo Contrast  06/18/2012  *RADIOLOGY REPORT*  Clinical Data: Unwitnessed fall; concern for head injury.  Patient on Coumadin.  CT HEAD WITHOUT CONTRAST  Technique:  Contiguous axial images were obtained from the base of the skull through the vertex without contrast.  Comparison: CT of the head performed 06/14/2012, and MRI of the brain performed 07/12/2010  Findings: There is no evidence of acute infarction, mass lesion, or intra- or extra-axial hemorrhage on CT.  Prominence of the ventricles and sulci reflects moderate cortical volume loss.  Cerebellar atrophy is noted.  Diffuse periventricular and subcortical white matter change likely reflects small vessel ischemic microangiopathy.  The brainstem and fourth ventricle are within normal limits.  The basal ganglia are unremarkable in appearance.  The cerebral hemispheres demonstrate grossly normal gray-white differentiation. No mass effect or midline  shift is seen.  There is no evidence of fracture; visualized osseous structures are unremarkable in appearance.  The orbits are within normal limits. There is partial opacification of the left maxillary sinus; the patient is status post left maxillary antrostomy and left-sided mastoidectomy.  The remaining paranasal sinuses and right mastoid air cells are well-aerated.  No significant soft tissue abnormalities are seen.  IMPRESSION:  1.  No evidence of traumatic intracranial injury or fracture. 2.  Moderate cortical volume loss and diffuse small vessel ischemic microangiopathy. 3.  Partial opacification of the left maxillary sinus.   Original Report Authenticated By: Tonia Ghent, M.D.     EKG: Independently reviewed. A. fib with rate controlled.  Assessment/Plan Principal Problem:   Closed left hip fracture Active Problems:   HTN (hypertension)   Dementia   A-fib on Coumadin   1. Left femoral neck fracture status post mechanical fall - at this time Dr. Magnus Ivan of orthopedics is to see the patient in consult. Further recommendations per orthopedics. If surgery is planned then may consult cardiology given patient's cardiac history for clearance. 2. Atrial fibrillation rate controlled - closely monitor in telemetry. Continue digoxin. Hold off Coumadin in anticipation of possible surgery. INR is pending. I have ordered type and screen and if INR is high may need to give FFP if surgery planned. 3. History of hypertension - presently off medications. I have placed patient on when necessary IV hydralazine for systolic blood pressure more than 160. 4. Dementia. 5. Leukocytosis -patient is afebrile. Chest x-ray does not show any infiltrates. UA is pending.   Patient's INR and UA are pending. Repeat labs have been ordered.     Code Status: Full code.  Family Communication: Patient's son.  Disposition Plan: Admit to inpatient.    Braydon Kullman N. [facility] Pager [phone].  If  7PM-7AM, please contact night-coverage [email or web address] Password [facility] 06/18/2012, 6:54 AM

## 2012-06-18 NOTE — Progress Notes (Signed)
SW received in report that patient is from [facility].   Sabino Niemann, MSW,  [phone]

## 2012-06-19 ENCOUNTER — Encounter (HOSPITAL_COMMUNITY): Payer: Self-pay | Admitting: *Deleted

## 2012-06-19 ENCOUNTER — Encounter (HOSPITAL_COMMUNITY): Payer: Self-pay | Admitting: Anesthesiology

## 2012-06-19 ENCOUNTER — Encounter (HOSPITAL_COMMUNITY)
Admission: EM | Disposition: A | Discharge status: Skilled Nursing Facility | Payer: Self-pay | Source: Home / Self Care | Attending: Internal Medicine

## 2012-06-19 ENCOUNTER — Inpatient Hospital Stay (HOSPITAL_COMMUNITY): Payer: Medicare Other | Admitting: Anesthesiology

## 2012-06-19 ENCOUNTER — Inpatient Hospital Stay (HOSPITAL_COMMUNITY): Payer: Medicare Other

## 2012-06-19 DIAGNOSIS — I10 Essential (primary) hypertension: Secondary | ICD-10-CM

## 2012-06-19 DIAGNOSIS — E876 Hypokalemia: Secondary | ICD-10-CM | POA: Diagnosis present

## 2012-06-19 HISTORY — PX: HIP ARTHROPLASTY: SHX981

## 2012-06-19 LAB — PROTIME-INR
INR: 1.59 — ABNORMAL HIGH (ref 0.00–1.49)
Prothrombin Time: 18.5 seconds — ABNORMAL HIGH (ref 11.6–15.2)

## 2012-06-19 LAB — URINE CULTURE

## 2012-06-19 LAB — BASIC METABOLIC PANEL
Calcium: 8.3 mg/dL — ABNORMAL LOW (ref 8.4–10.5)
GFR calc non Af Amer: 60 mL/min — ABNORMAL LOW (ref >90)
Sodium: 136 mEq/L (ref 135–145)

## 2012-06-19 LAB — CBC
MCH: 28.8 pg (ref 26.0–34.0)
MCHC: 34.8 g/dL (ref 30.0–36.0)
Platelets: 341 10*3/uL (ref 150–400)
RDW: 15.8 % — ABNORMAL HIGH (ref 11.5–15.5)

## 2012-06-19 SURGERY — HEMIARTHROPLASTY, HIP, DIRECT ANTERIOR APPROACH, FOR FRACTURE
Anesthesia: General | Site: Hip | Laterality: Left | Wound class: Clean

## 2012-06-19 MED ORDER — MUPIROCIN 2 % EX OINT
1.0000 "application " | TOPICAL_OINTMENT | Freq: Two times a day (BID) | CUTANEOUS | Status: DC
  Administered 2012-06-19 – 2012-06-21 (×5): 1 via NASAL
  Filled 2012-06-19: qty 22

## 2012-06-19 MED ORDER — METHOCARBAMOL 500 MG PO TABS: 500.0000 mg | ORAL_TABLET | Freq: Four times a day (QID) | ORAL | Status: DC | PRN

## 2012-06-19 MED ORDER — METOCLOPRAMIDE HCL 5 MG/ML IJ SOLN: 5.0000 mg | Freq: Three times a day (TID) | INTRAMUSCULAR | Status: DC | PRN

## 2012-06-19 MED ORDER — ACETAMINOPHEN 10 MG/ML IV SOLN
INTRAVENOUS | Status: DC | PRN
  Administered 2012-06-19: 1000 mg via INTRAVENOUS

## 2012-06-19 MED ORDER — LACTATED RINGERS IV SOLN
INTRAVENOUS | Status: DC
  Administered 2012-06-19: 10:00:00 via INTRAVENOUS

## 2012-06-19 MED ORDER — WARFARIN SODIUM 4 MG PO TABS
4.0000 mg | ORAL_TABLET | Freq: Once | ORAL | Status: AC
  Administered 2012-06-19: 4 mg via ORAL
  Filled 2012-06-19: qty 1

## 2012-06-19 MED ORDER — ACETAMINOPHEN 10 MG/ML IV SOLN
INTRAVENOUS | Status: AC
  Filled 2012-06-19: qty 100

## 2012-06-19 MED ORDER — ROCURONIUM BROMIDE 100 MG/10ML IV SOLN
INTRAVENOUS | Status: DC | PRN
  Administered 2012-06-19: 35 mg via INTRAVENOUS

## 2012-06-19 MED ORDER — ACETAMINOPHEN 325 MG PO TABS
650.0000 mg | ORAL_TABLET | Freq: Four times a day (QID) | ORAL | Status: DC | PRN
  Administered 2012-06-20 – 2012-06-21 (×2): 650 mg via ORAL
  Filled 2012-06-19 (×2): qty 2

## 2012-06-19 MED ORDER — ONDANSETRON HCL 4 MG/2ML IJ SOLN: 4.0000 mg | Freq: Once | INTRAMUSCULAR | Status: DC | PRN

## 2012-06-19 MED ORDER — CEFAZOLIN SODIUM-DEXTROSE 2-3 GM-% IV SOLR
INTRAVENOUS | Status: AC
  Administered 2012-06-19: 2 g via INTRAVENOUS
  Filled 2012-06-19: qty 50

## 2012-06-19 MED ORDER — CEFAZOLIN SODIUM-DEXTROSE 2-3 GM-% IV SOLR
2.0000 g | Freq: Four times a day (QID) | INTRAVENOUS | Status: AC
  Administered 2012-06-19 (×2): 2 g via INTRAVENOUS
  Filled 2012-06-19 (×2): qty 50

## 2012-06-19 MED ORDER — POTASSIUM CHLORIDE 10 MEQ/100ML IV SOLN
10.0000 meq | INTRAVENOUS | Status: AC
  Administered 2012-06-19 (×4): 10 meq via INTRAVENOUS
  Filled 2012-06-19 (×4): qty 100

## 2012-06-19 MED ORDER — GLYCOPYRROLATE 0.2 MG/ML IJ SOLN
INTRAMUSCULAR | Status: DC | PRN
  Administered 2012-06-19: .5 mg via INTRAVENOUS

## 2012-06-19 MED ORDER — MENTHOL 3 MG MT LOZG: 1.0000 | LOZENGE | OROMUCOSAL | Status: DC | PRN

## 2012-06-19 MED ORDER — CHLORHEXIDINE GLUCONATE CLOTH 2 % EX PADS
6.0000 | MEDICATED_PAD | Freq: Every day | CUTANEOUS | Status: DC
  Administered 2012-06-21: 6 via TOPICAL

## 2012-06-19 MED ORDER — NEOSTIGMINE METHYLSULFATE 1 MG/ML IJ SOLN
INTRAMUSCULAR | Status: DC | PRN
  Administered 2012-06-19: 4 mg via INTRAVENOUS

## 2012-06-19 MED ORDER — LIDOCAINE HCL (CARDIAC) 20 MG/ML IV SOLN
INTRAVENOUS | Status: DC | PRN
  Administered 2012-06-19: 50 mg via INTRAVENOUS

## 2012-06-19 MED ORDER — FENTANYL CITRATE 0.05 MG/ML IJ SOLN: 25.0000 ug | INTRAMUSCULAR | Status: DC | PRN

## 2012-06-19 MED ORDER — SODIUM CHLORIDE 0.9 % IV SOLN
INTRAVENOUS | Status: DC
  Administered 2012-06-19 – 2012-06-21 (×3): via INTRAVENOUS

## 2012-06-19 MED ORDER — SODIUM CHLORIDE 0.9 % IR SOLN
Status: DC | PRN
  Administered 2012-06-19: 1000 mL

## 2012-06-19 MED ORDER — PROPOFOL INFUSION 10 MG/ML OPTIME
INTRAVENOUS | Status: DC | PRN
  Administered 2012-06-19: 50 ug/kg/min via INTRAVENOUS

## 2012-06-19 MED ORDER — FENTANYL CITRATE 0.05 MG/ML IJ SOLN
INTRAMUSCULAR | Status: DC | PRN
Start: 2012-06-19 — End: 2012-06-19
  Administered 2012-06-19 (×3): 50 ug via INTRAVENOUS

## 2012-06-19 MED ORDER — PROPOFOL 10 MG/ML IV BOLUS
INTRAVENOUS | Status: DC | PRN
  Administered 2012-06-19: 80 mg via INTRAVENOUS

## 2012-06-19 MED ORDER — DEXTROSE 5 % IV SOLN
500.0000 mg | Freq: Four times a day (QID) | INTRAVENOUS | Status: DC | PRN
  Filled 2012-06-19: qty 5

## 2012-06-19 MED ORDER — ONDANSETRON HCL 4 MG/2ML IJ SOLN: 4.0000 mg | Freq: Four times a day (QID) | INTRAMUSCULAR | Status: DC | PRN

## 2012-06-19 MED ORDER — OXYCODONE HCL 5 MG PO TABS: 5.0000 mg | ORAL_TABLET | Freq: Once | ORAL | Status: DC | PRN

## 2012-06-19 MED ORDER — PHENOL 1.4 % MT LIQD: 1.0000 | OROMUCOSAL | Status: DC | PRN

## 2012-06-19 MED ORDER — ACETAMINOPHEN 650 MG RE SUPP: 650.0000 mg | Freq: Four times a day (QID) | RECTAL | Status: DC | PRN

## 2012-06-19 MED ORDER — OXYCODONE HCL 5 MG/5ML PO SOLN: 5.0000 mg | Freq: Once | ORAL | Status: DC | PRN

## 2012-06-19 MED ORDER — ONDANSETRON HCL 4 MG PO TABS: 4.0000 mg | ORAL_TABLET | Freq: Four times a day (QID) | ORAL | Status: DC | PRN

## 2012-06-19 MED ORDER — WARFARIN - PHARMACIST DOSING INPATIENT: Freq: Every day | Status: DC

## 2012-06-19 MED ORDER — ZOLPIDEM TARTRATE 5 MG PO TABS: 5.0000 mg | ORAL_TABLET | Freq: Every evening | ORAL | Status: DC | PRN

## 2012-06-19 MED ORDER — MORPHINE SULFATE 2 MG/ML IJ SOLN: 0.5000 mg | INTRAMUSCULAR | Status: DC | PRN

## 2012-06-19 MED ORDER — HYDROCODONE-ACETAMINOPHEN 5-325 MG PO TABS
1.0000 | ORAL_TABLET | Freq: Four times a day (QID) | ORAL | Status: DC | PRN
  Administered 2012-06-21: 1 via ORAL
  Filled 2012-06-19: qty 1
  Filled 2012-06-19: qty 2

## 2012-06-19 MED ORDER — LACTATED RINGERS IV SOLN
INTRAVENOUS | Status: DC | PRN
  Administered 2012-06-19: 10:00:00 via INTRAVENOUS

## 2012-06-19 MED ORDER — DOCUSATE SODIUM 100 MG PO CAPS
100.0000 mg | ORAL_CAPSULE | Freq: Two times a day (BID) | ORAL | Status: DC
  Administered 2012-06-19 – 2012-06-21 (×5): 100 mg via ORAL
  Filled 2012-06-19 (×4): qty 1

## 2012-06-19 MED ORDER — METOCLOPRAMIDE HCL 10 MG PO TABS: 5.0000 mg | ORAL_TABLET | Freq: Three times a day (TID) | ORAL | Status: DC | PRN

## 2012-06-19 SURGICAL SUPPLY — 56 items
BLADE SAGITTAL (BLADE) ×2
BLADE SAW THK.89X75X18XSGTL (BLADE) ×1 IMPLANT
BRUSH FEMORAL CANAL (MISCELLANEOUS) IMPLANT
CLOTH BEACON ORANGE TIMEOUT ST (SAFETY) ×2 IMPLANT
COVER BACK TABLE 24X17X13 BIG (DRAPES) IMPLANT
COVER SURGICAL LIGHT HANDLE (MISCELLANEOUS) ×2 IMPLANT
DRAPE HIP W/POCKET STRL (DRAPE) ×2 IMPLANT
DRAPE INCISE IOBAN 85X60 (DRAPES) ×3 IMPLANT
DRAPE ORTHO SPLIT 77X108 STRL (DRAPES)
DRAPE SURG ORHT 6 SPLT 77X108 (DRAPES) ×2 IMPLANT
DRAPE U-SHAPE 47X51 STRL (DRAPES) ×2 IMPLANT
DRILL BIT 7/64X5 (BIT) IMPLANT
DRSG MEPILEX BORDER 4X8 (GAUZE/BANDAGES/DRESSINGS) ×2 IMPLANT
DURAPREP 26ML APPLICATOR (WOUND CARE) ×2 IMPLANT
ELECT BLADE 6.5 EXT (BLADE) IMPLANT
ELECT CAUTERY BLADE 6.4 (BLADE) ×2 IMPLANT
ELECT REM PT RETURN 9FT ADLT (ELECTROSURGICAL) ×2
ELECTRODE REM PT RTRN 9FT ADLT (ELECTROSURGICAL) ×1 IMPLANT
EVACUATOR 1/8 PVC DRAIN (DRAIN) IMPLANT
GLOVE BIO SURGEON STRL SZ 6.5 (GLOVE) ×1 IMPLANT
GLOVE BIO SURGEON STRL SZ7 (GLOVE) ×1 IMPLANT
GLOVE BIO SURGEON STRL SZ7.5 (GLOVE) ×2 IMPLANT
GLOVE BIOGEL PI IND STRL 7.0 (GLOVE) IMPLANT
GLOVE BIOGEL PI IND STRL 7.5 (GLOVE) IMPLANT
GLOVE BIOGEL PI IND STRL 8 (GLOVE) ×1 IMPLANT
GLOVE BIOGEL PI INDICATOR 7.0 (GLOVE) ×3
GLOVE BIOGEL PI INDICATOR 7.5 (GLOVE) ×1
GLOVE BIOGEL PI INDICATOR 8 (GLOVE) ×1
GLOVE ORTHO TXT STRL SZ7.5 (GLOVE) ×3 IMPLANT
GOWN PREVENTION PLUS LG XLONG (DISPOSABLE) IMPLANT
GOWN PREVENTION PLUS XLARGE (GOWN DISPOSABLE) ×4 IMPLANT
GOWN STRL NON-REIN LRG LVL3 (GOWN DISPOSABLE) ×4 IMPLANT
HANDPIECE INTERPULSE COAX TIP (DISPOSABLE)
KIT BASIN OR (CUSTOM PROCEDURE TRAY) ×2 IMPLANT
KIT ROOM TURNOVER OR (KITS) ×2 IMPLANT
MANIFOLD NEPTUNE II (INSTRUMENTS) ×2 IMPLANT
NS IRRIG 1000ML POUR BTL (IV SOLUTION) ×2 IMPLANT
PACK TOTAL JOINT (CUSTOM PROCEDURE TRAY) ×2 IMPLANT
PAD ARMBOARD 7.5X6 YLW CONV (MISCELLANEOUS) ×4 IMPLANT
PASSER SUT SWANSON 36MM LOOP (INSTRUMENTS) IMPLANT
SET HNDPC FAN SPRY TIP SCT (DISPOSABLE) IMPLANT
SPONGE LAP 18X18 X RAY DECT (DISPOSABLE) ×2 IMPLANT
SPONGE LAP 4X18 X RAY DECT (DISPOSABLE) IMPLANT
STAPLER VISISTAT 35W (STAPLE) ×2 IMPLANT
SUCTION FRAZIER TIP 10 FR DISP (SUCTIONS) ×2 IMPLANT
SUT ETHIBOND NAB CT1 #1 30IN (SUTURE) ×4 IMPLANT
SUT FIBERWIRE #2 38 T-5 BLUE (SUTURE)
SUT VIC AB 1 CTB1 27 (SUTURE) ×4 IMPLANT
SUT VIC AB 2-0 CT1 27 (SUTURE) ×4
SUT VIC AB 2-0 CT1 TAPERPNT 27 (SUTURE) ×2 IMPLANT
SUTURE FIBERWR #2 38 T-5 BLUE (SUTURE) ×2 IMPLANT
TOWEL OR 17X24 6PK STRL BLUE (TOWEL DISPOSABLE) ×2 IMPLANT
TOWEL OR 17X26 10 PK STRL BLUE (TOWEL DISPOSABLE) ×2 IMPLANT
TOWER CARTRIDGE SMART MIX (DISPOSABLE) IMPLANT
TRAY FOLEY CATH 14FR (SET/KITS/TRAYS/PACK) IMPLANT
WATER STERILE IRR 1000ML POUR (IV SOLUTION) ×6 IMPLANT

## 2012-06-19 NOTE — Care Management Note (Signed)
CARE MANAGEMENT NOTE 06/19/2012  Patient:  Alexander Lyons, Alexander Lyons   Account Number:  [id number]  Date Initiated:  06/19/2012  Documentation initiated by:  Vance Peper  Subjective/Objective Assessment:   79 yr old male s/p left hip hemiarthroplasty.     Action/Plan:   Patient is from [facility] and will return at discharge. Social worker is aware.   Anticipated DC Date:  06/21/2012   Anticipated DC Plan:  SKILLED NURSING FACILITY  In-house referral  Clinical Social Worker      DC Planning Services  CM consult      Choice offered to / List presented to:             Status of service:  Completed, signed off Medicare Important Message given?   (If response is "NO", the following Medicare IM given date fields will be blank) Date Medicare IM given:   Date Additional Medicare IM given:    Discharge Disposition:  SKILLED NURSING FACILITY  Per UR Regulation:    If discussed at Long Length of Stay Meetings, dates discussed:    Comments:

## 2012-06-19 NOTE — Progress Notes (Signed)
ANTICOAGULATION CONSULT NOTE - Initial Consult  Pharmacy Consult for Coumadin Indication: atrial fibrillation and VTE prophylaxis s/p L hip hemiarthroplasty  No Known Allergies  Patient Measurements:   Ht ~ 72 inches Wt ~ 53.4 kg  Vital Signs: Temp: 97.6 F (36.4 C) (04/10 1336) Temp src: Oral (04/10 0907) BP: 165/81 mmHg (04/10 1336) Pulse Rate: 86 (04/10 1336)  Labs:  Recent Labs  06/18/12 0401 06/18/12 0511 06/18/12 0900 06/18/12 1716 06/19/12 0630  HGB 12.6*  --  12.1*  --  11.3*  HCT 36.3*  --  34.3*  --  32.5*  PLT 352  --  350  --  341  LABPROT  --  38.5*  --  19.3* 18.5*  INR  --  4.29*  --  1.69* 1.59*  CREATININE 1.27  --  1.14  --  1.03   CrCl ~ 45 ml/hr  Medical History: Past Medical History  Diagnosis Date  . HTN (hypertension)   . Dementia   . History of CVA (cerebrovascular accident)     no residual deficit (this apparently occured shortly after coming off of aspirin for cataract surgery.  Of note, old records show MRI head w/out contrast 07/2010 showed chronic small vessel infarctions w/in the pons + chronic small vessel dz throughout the brain.  . Atrial fibrillation   . [facility] impairment in [facility] 2    [facility] but [facility] won't wear them  . Colon polyp 2002  . History of dizziness   . Elevated PSA 2012    PSA 28.13 Apr 2010: Urology eval (Dr. Cleone Slim)  . SCC (squamous cell carcinoma), leg 2010    left  . History of rib fracture 2006    4 on right; nondisplaced (s/p lawn mower fell on him)  . Vertebral compression fracture     30% anterior wedge compression deformity T11    Medications:  Prescriptions prior to admission  Medication Sig Dispense Refill  . digoxin (LANOXIN) 0.125 MG tablet Take 0.125 mg by mouth daily.       Marland Kitchen donepezil (ARICEPT) 10 MG tablet Take 10 mg by mouth at bedtime.      Marland Kitchen lisinopril-hydrochlorothiazide (PRINZIDE,ZESTORETIC) 20-12.5 MG per tablet Take 1 tablet by mouth daily.      . megestrol (MEGACE)  40 MG/ML suspension Take 200 mg by mouth daily.      Marland Kitchen warfarin (COUMADIN) 2.5 MG tablet Take 1.25-2.5 mg by mouth daily. Takes 2.5 mg on Monday and Thursday. Takes 1.25 mg all other days.        Assessment: 79 yo M on Coumadin PTA for hx afib.  Pt fell PTA and presented for L hip surgery.  INR was reversed with 10 mg IV Vitamin K.  INR today 1.59.  Home Coumadin dose = 1.25 mg daily except 2.5mg  Mon and Thurs.   Goal of Therapy:  INR 2-3 Monitor platelets by anticoagulation protocol: Yes   Plan:  Coumadin 4 mg PO x 1 tonight. Daily INR.  [employer], Pharm.D., BCPS Clinical Pharmacist Pager [phone] 06/19/2012 1:56 PM

## 2012-06-19 NOTE — Progress Notes (Signed)
Clinical Social Work Department  BRIEF PSYCHOSOCIAL ASSESSMENT  Patient: Alexander Lyons  Account Number: [id number]   Admit date: 06/18/12 Clinical Social Worker: Sabino Niemann, MSW Date/Time: 06/19/2012 3:53 Referred by: Physician Date Referred: 06/19/2012 Referred for   SNF Placement   Other Referral:  Interview type: Patient's son- Patient has severe dementia Other interview type: PSYCHOSOCIAL DATA  Living Status: facility Admitted from facility: [facility] Level of care: SNF Primary support name: Minnie Shi Primary support relationship to patient: Son Degree of support available:  Strong and vested  CURRENT CONCERNS  Current Concerns   Post-Acute Placement   Other Concerns:  SOCIAL WORK ASSESSMENT / PLAN  CSW contacted patient's son  re: PT recommendation for SNF.   Pt lives at [facility]  CSW completed FL2 and sent clinicals to [facility].          Assessment/plan status: Information/Referral to [facility]  Other assessment/ plan:  Information/referral to community resources:  SNF     PATIENT'S/FAMILY'S RESPONSE TO PLAN OF CARE:  Pt's son  reports he is agreeable to  Patient retuning to [facility] in order to increase strength and independence with mobility prior to returning to ALF   Pt's son verbalized understanding of placement process and appreciation for CSW assist.   Sabino Niemann, MSW [phone]

## 2012-06-19 NOTE — Preoperative (Signed)
Beta Blockers   Reason not to administer Beta Blockers:Not Applicable 

## 2012-06-19 NOTE — Progress Notes (Signed)
Received report from Elise RN 

## 2012-06-19 NOTE — Anesthesia Procedure Notes (Signed)
Procedure Name: Intubation Date/Time: 06/19/2012 10:34 AM Performed by: Brien Mates DOBSON Pre-anesthesia Checklist: Patient identified, Emergency Drugs available, Suction available, Patient being monitored and Timeout performed Patient Re-evaluated:Patient Re-evaluated prior to inductionOxygen Delivery Method: Circle system utilized Preoxygenation: Pre-oxygenation with 100% oxygen Intubation Type: IV induction Ventilation: Mask ventilation without difficulty Laryngoscope Size: Miller and 2 (DL x 1 with miller 2, DL x 1 with Glidescope) Grade View: Grade I Tube type: Oral Number of attempts: 2 (DL with miller 2- only able to visualize epiglottis due to stiff neck.  no attempt at placement of ETT.  DL x 1 with Glidescope- grade 1 view and easly placeement of ETT. ) Airway Equipment and Method: Stylet and Video-laryngoscopy Placement Confirmation: ETT inserted through vocal cords under direct vision,  positive ETCO2 and breath sounds checked- equal and bilateral Secured at: 22 cm Tube secured with: Tape Dental Injury: Teeth and Oropharynx as per pre-operative assessment

## 2012-06-19 NOTE — Anesthesia Postprocedure Evaluation (Signed)
  Anesthesia Post-op Note  Patient: Alexander Lyons  Procedure(s) Performed: Procedure(s): Left hip hemiarthroplasty (Left)  Patient Location: PACU  Anesthesia Type:General  Level of Consciousness: confused  Airway and Oxygen Therapy: Patient Spontanous Breathing  Post-op Pain: mild  Post-op Assessment: Post-op Vital signs reviewed  Post-op Vital Signs: stable  Complications: No apparent anesthesia complications

## 2012-06-19 NOTE — Progress Notes (Addendum)
[facility] PROGRESS NOTE  Alexander Lyons XBJ:[id number] DOB: September 16, 1916 DOA: 06/18/2012 PCP: Jeoffrey Massed, MD  Brief narrative 79 y.o. male with history of advanced dementia, atrial fibrillation, hypertension  who was recently admitted for dehydration and hypotension was brought to the ER with persistent pain in the left hip area after sustaining a fall. Imaging showed a left femoral neck fracture.  Assessment/Plan: Left femoral neck fracture  S/p mechanical fall. Family wished for surgery as patient was ambulating prior to fall and ith pain will have an impaired quality of life. For OR today.  Afib  on coumadin with supratheaperutic INR Coumadin held and a dose of vitamin k given. INR improved today. Rate controlled. continue digoxin.   HTN not on meds. BP stable. Prn hydralazine  Advanced dementia Continue aricept  Hypokalemia  will replenish with iv kcl   Code Status: full code Family Communication:none at bedside Disposition Plan: will need SNF upon d/c   Consultants:  Ortho ( Dr Magnus Ivan)  Procedures:  For left hemiarthroplasty today  Antibiotics:  none  HPI/Subjective: Patient seen and examined this am. No overnight issues. patient very demented.  For OR today  Objective: Filed Vitals:   06/18/12 2048 06/19/12 0000 06/19/12 0601 06/19/12 0907  BP: 140/80  167/87 173/96  Pulse: 81  83 90  Temp: 98.6 F (37 C)  98.4 F (36.9 C) 97 F (36.1 C)  TempSrc: Oral  Oral Oral  Resp: 18 18 16 18   SpO2: 97% 100% 96% 100%    Intake/Output Summary (Last 24 hours) at 06/19/12 1015 Last data filed at 06/19/12 0900  Gross per 24 hour  Intake      0 ml  Output    900 ml  Net   -900 ml   There were no vitals filed for this visit.  Exam:   General: elderly male lying in bed in NAD  HEENT: no pallor, moist mucosa  Chest: clear b/l   CVS: N S1&S2  Abd: soft, BS,+, non tender, non distended  Ext: warm, painful to palpation over left hip.  Foley in place  CNS: AAOX0  Data Reviewed: Basic Metabolic Panel:  Recent Labs Lab 06/14/12 2125 06/18/12 0401 06/18/12 0900 06/19/12 0630  NA 136 139 139 136  K 4.6 3.2* 3.0* 3.1*  CL 104 101 104 104  CO2 21 24 24 23   GLUCOSE 99 140* 128* 102*  BUN 26* 28* 27* 20  CREATININE 1.18 1.27 1.14 1.03  CALCIUM 9.1 8.9 8.5 8.3*   Liver Function Tests:  Recent Labs Lab 06/14/12 2125 06/18/12 0900  AST 21 16  ALT 8 11  ALKPHOS 47 45  BILITOT 0.7 0.7  PROT 8.2 7.2  ALBUMIN 3.5 2.9*    Recent Labs Lab 06/14/12 2125  LIPASE 26   No results found for this basename: AMMONIA,  in the last 168 hours CBC:  Recent Labs Lab 06/14/12 2125 06/18/12 0401 06/18/12 0900 06/19/12 0630  WBC 10.9* 15.7* 14.5* 13.7*  NEUTROABS 7.7 12.4* 11.1*  --   HGB 13.8 12.6* 12.1* 11.3*  HCT 39.3 36.3* 34.3* 32.5*  MCV 85.4 83.1 82.9 82.7  PLT 369 352 350 341   Cardiac Enzymes: No results found for this basename: CKTOTAL, CKMB, CKMBINDEX, TROPONINI,  in the last 168 hours BNP (last 3 results) No results found for this basename: PROBNP,  in the last 8760 hours CBG: No results found for this basename: GLUCAP,  in the last 168 hours  Recent Results (from the past  240 hour(s))  URINE CULTURE     Status: None   Collection Time    06/18/12  6:48 AM      Result Value Range Status   Specimen Description URINE, RANDOM   Final   Special Requests NONE   Final   Culture  Setup Time 06/18/2012 06:48   Final   Colony Count NO GROWTH   Final   Culture NO GROWTH   Final   Report Status 06/19/2012 FINAL   Final     Studies: Dg Chest 1 View  06/18/2012  *RADIOLOGY REPORT*  Clinical Data: Status post fall; concern for chest injury.  CHEST - 1 VIEW  Comparison: Chest radiograph performed 06/14/2012  Findings: The lungs are well-aerated.  Mild bibasilar airspace opacities could reflect mild interstitial edema; vascular congestion has improved.  There is mild elevation of the right hemidiaphragm.  No  definite pleural effusion or pneumothorax is seen.  The cardiomediastinal silhouette is enlarged; calcification is noted within the aortic arch.  No acute osseous abnormalities are seen.  IMPRESSION:  1.  Mild bibasilar airspace opacities could reflect mild interstitial edema; vascular congestion has improved. 2.  Cardiomegaly noted.   Original Report Authenticated By: Tonia Ghent, M.D.    Dg Hip Complete Left  06/18/2012  *RADIOLOGY REPORT*  Clinical Data: Status post fall; left hip pain.  LEFT HIP - COMPLETE 2+ VIEW  Comparison: None.  Findings: There is a mildly displaced subcapital fracture through the left femoral neck.  No additional fractures are seen.  The right hip is unremarkable in appearance.  No significant degenerative change is appreciated.  The sacroiliac joints are unremarkable in appearance.  The visualized bowel gas pattern is grossly unremarkable in appearance.  Scattered vascular calcifications are seen.  IMPRESSION:  1.  Mildly displaced subcapital fracture through the left femoral neck. 2.  Scattered vascular calcifications seen.   Original Report Authenticated By: Tonia Ghent, M.D.    Dg Femur Left  06/18/2012  *RADIOLOGY REPORT*  Clinical Data: Status post fall; left hip pain.  LEFT FEMUR - 2 VIEW  Comparison: None.  Findings: There is a displaced subcapital left femoral neck fracture.  The left femoral head remains seated at the acetabulum. No additional fractures are seen.  The degree of displacement varies depending on positioning.  The remainder of the left femur appears intact.  An enthesophyte is noted arising at the superior pole of the patella.  Scattered vascular calcifications are seen.  No additional soft tissue abnormalities are characterized.  IMPRESSION:  1.  Displaced subcapital left femoral neck fracture noted. 2.  Scattered vascular calcifications seen.   Original Report Authenticated By: Tonia Ghent, M.D.    Ct Head Wo Contrast  06/18/2012  *RADIOLOGY REPORT*   Clinical Data: Unwitnessed fall; concern for head injury.  Patient on Coumadin.  CT HEAD WITHOUT CONTRAST  Technique:  Contiguous axial images were obtained from the base of the skull through the vertex without contrast.  Comparison: CT of the head performed 06/14/2012, and MRI of the brain performed 07/12/2010  Findings: There is no evidence of acute infarction, mass lesion, or intra- or extra-axial hemorrhage on CT.  Prominence of the ventricles and sulci reflects moderate cortical volume loss.  Cerebellar atrophy is noted.  Diffuse periventricular and subcortical white matter change likely reflects small vessel ischemic microangiopathy.  The brainstem and fourth ventricle are within normal limits.  The basal ganglia are unremarkable in appearance.  The cerebral hemispheres demonstrate grossly normal gray-white differentiation. No mass  effect or midline shift is seen.  There is no evidence of fracture; visualized osseous structures are unremarkable in appearance.  The orbits are within normal limits. There is partial opacification of the left maxillary sinus; the patient is status post left maxillary antrostomy and left-sided mastoidectomy.  The remaining paranasal sinuses and right mastoid air cells are well-aerated.  No significant soft tissue abnormalities are seen.  IMPRESSION:  1.  No evidence of traumatic intracranial injury or fracture. 2.  Moderate cortical volume loss and diffuse small vessel ischemic microangiopathy. 3.  Partial opacification of the left maxillary sinus.   Original Report Authenticated By: Tonia Ghent, M.D.     Scheduled Meds: . [facility] HOLD] digoxin  0.125 mg Oral Daily  . [MAR HOLD] donepezil  5 mg Oral QHS   Continuous Infusions: . lactated ringers 50 mL/hr at 06/19/12 1008       Time spent: 25 minutes    Niko Penson  [facility] Pager [phone] If 7PM-7AM, please contact night-coverage at [email or web address], password [facility] 06/19/2012, 10:15 AM  LOS: 1 day

## 2012-06-19 NOTE — Progress Notes (Signed)
Report given to Rebecca S RN  

## 2012-06-19 NOTE — Anesthesia Preprocedure Evaluation (Addendum)
Anesthesia Evaluation  Patient identified by MRN, date of birth, ID band Patient awake  General Assessment Comment:Baseline dementia. Oriented to person.   Reviewed: Allergy & Precautions, H&P , NPO status , Patient's Chart, lab work & pertinent test results, reviewed documented beta blocker date and time   Airway Mallampati: II  Neck ROM: full    Dental  (+) Teeth Intact and Dental Advisory Given   Pulmonary          Cardiovascular hypertension, + dysrhythmias Atrial Fibrillation     Neuro/Psych CVA    GI/Hepatic   Endo/Other    Renal/GU      Musculoskeletal   Abdominal   Peds  Hematology   Anesthesia Other Findings   Reproductive/Obstetrics                          Anesthesia Physical Anesthesia Plan  ASA: III  Anesthesia Plan: General   Post-op Pain Management:    Induction: Intravenous  Airway Management Planned: Oral ETT  Additional Equipment:   Intra-op Plan:   Post-operative Plan: Extubation in OR  Informed Consent: I have reviewed the patients History and Physical, chart, labs and discussed the procedure including the risks, benefits and alternatives for the proposed anesthesia with the patient or authorized representative who has indicated his/her understanding and acceptance.   Dental advisory given  Plan Discussed with: CRNA, Surgeon and Anesthesiologist  Anesthesia Plan Comments:        Anesthesia Quick Evaluation

## 2012-06-19 NOTE — Brief Op Note (Signed)
06/18/2012 - 06/19/2012  11:39 AM  PATIENT:  Alexander Lyons  79 y.o. male  PRE-OPERATIVE DIAGNOSIS:  Left hip fx  POST-OPERATIVE DIAGNOSIS:  Left hip fx  PROCEDURE:  Procedure(s): Left hip hemiarthroplasty (Left)  SURGEON:  Surgeon(s) and Role:    * Kathryne Hitch, MD - Primary  PHYSICIAN ASSISTANT: Rexene Edison, PA-C  ANESTHESIA:   general  EBL:  Total I/O In: 600 [I.V.:600] Out: 450 [Urine:350; Blood:100]  BLOOD ADMINISTERED:none  DRAINS: none   LOCAL MEDICATIONS USED:  NONE  SPECIMEN:  No Specimen  DISPOSITION OF SPECIMEN:  N/A  COUNTS:  YES  TOURNIQUET:  * No tourniquets in log *  DICTATION: .Other Dictation: Dictation Number [id number]  PLAN OF CARE: Admit to inpatient   PATIENT DISPOSITION:  PACU - hemodynamically stable.   Delay start of Pharmacological VTE agent (>24hrs) due to surgical blood loss or risk of bleeding: no

## 2012-06-19 NOTE — H&P (Signed)
  I spoke with his sons in length who both have POA.  They both agree with the need for a left hip hemiarthroplasty given his fracture and pain.  He does ambulate and even sitting up is causing severe discomfort.  They agree to surgery due to quality of life issues.  They fully understand the risks of death, perioperative complications, etc.

## 2012-06-19 NOTE — Transfer of Care (Signed)
Immediate Anesthesia Transfer of Care Note  Patient: Alexander Lyons  Procedure(s) Performed: Procedure(s): Left hip hemiarthroplasty (Left)  Patient Location: PACU  Anesthesia Type:General  Level of Consciousness: sedated  Airway & Oxygen Therapy: Patient Spontanous Breathing and Patient connected to face mask oxygen  Post-op Assessment: Report given to PACU RN and Post -op Vital signs reviewed and stable  Post vital signs: Reviewed and stable  Complications: No apparent anesthesia complications

## 2012-06-19 NOTE — Progress Notes (Signed)
UR COMPLETED  

## 2012-06-20 ENCOUNTER — Inpatient Hospital Stay (HOSPITAL_COMMUNITY): Payer: Medicare Other

## 2012-06-20 ENCOUNTER — Encounter (HOSPITAL_COMMUNITY): Payer: Self-pay | Admitting: Orthopaedic Surgery

## 2012-06-20 DIAGNOSIS — S72009D Fracture of unspecified part of neck of unspecified femur, subsequent encounter for closed fracture with routine healing: Secondary | ICD-10-CM

## 2012-06-20 DIAGNOSIS — R791 Abnormal coagulation profile: Secondary | ICD-10-CM

## 2012-06-20 LAB — BASIC METABOLIC PANEL
CO2: 23 mEq/L (ref 19–32)
Calcium: 8.3 mg/dL — ABNORMAL LOW (ref 8.4–10.5)
Chloride: 105 mEq/L (ref 96–112)
Creatinine, Ser: 1.14 mg/dL (ref 0.50–1.35)
Glucose, Bld: 121 mg/dL — ABNORMAL HIGH (ref 70–99)

## 2012-06-20 LAB — CBC
HCT: 30.7 % — ABNORMAL LOW (ref 39.0–52.0)
Hemoglobin: 10.8 g/dL — ABNORMAL LOW (ref 13.0–17.0)
MCH: 29.2 pg (ref 26.0–34.0)
MCV: 83 fL (ref 78.0–100.0)
RBC: 3.7 MIL/uL — ABNORMAL LOW (ref 4.22–5.81)

## 2012-06-20 MED ORDER — METHOCARBAMOL 500 MG PO TABS: 500.0000 mg | ORAL_TABLET | Freq: Four times a day (QID) | ORAL | Status: AC | PRN

## 2012-06-20 MED ORDER — LISINOPRIL 10 MG PO TABS
10.0000 mg | ORAL_TABLET | Freq: Every day | ORAL | Status: DC
  Administered 2012-06-20: 10 mg via ORAL
  Filled 2012-06-20 (×2): qty 1

## 2012-06-20 MED ORDER — MEGESTROL ACETATE 40 MG/ML PO SUSP
200.0000 mg | Freq: Every day | ORAL | Status: DC
  Administered 2012-06-20 – 2012-06-21 (×2): 200 mg via ORAL
  Filled 2012-06-20 (×2): qty 5

## 2012-06-20 MED ORDER — ACETAMINOPHEN-CODEINE #3 300-30 MG PO TABS: 1.0000 | ORAL_TABLET | ORAL | Status: AC | PRN

## 2012-06-20 MED ORDER — HYDROCHLOROTHIAZIDE 12.5 MG PO CAPS
12.5000 mg | ORAL_CAPSULE | Freq: Every day | ORAL | Status: DC
  Administered 2012-06-20: 12.5 mg via ORAL
  Filled 2012-06-20 (×2): qty 1

## 2012-06-20 MED ORDER — MIRTAZAPINE 7.5 MG PO TABS
7.5000 mg | ORAL_TABLET | Freq: Every day | ORAL | Status: DC
  Filled 2012-06-20: qty 1

## 2012-06-20 MED ORDER — DONEPEZIL HCL 10 MG PO TABS
10.0000 mg | ORAL_TABLET | Freq: Every day | ORAL | Status: DC
Start: 2012-06-20 — End: 2012-06-21
  Administered 2012-06-20: 10 mg via ORAL
  Filled 2012-06-20 (×2): qty 1

## 2012-06-20 MED ORDER — LISINOPRIL-HYDROCHLOROTHIAZIDE 20-12.5 MG PO TABS: 1.0000 | ORAL_TABLET | Freq: Every day | ORAL | Status: DC

## 2012-06-20 MED ORDER — WARFARIN SODIUM 5 MG PO TABS: 5.0000 mg | ORAL_TABLET | Freq: Every day | ORAL | Status: DC

## 2012-06-20 MED ORDER — HYDROCODONE-ACETAMINOPHEN 5-325 MG PO TABS: 1.0000 | ORAL_TABLET | Freq: Four times a day (QID) | ORAL | Status: AC | PRN

## 2012-06-20 MED ORDER — DSS 100 MG PO CAPS: 100.0000 mg | ORAL_CAPSULE | Freq: Two times a day (BID) | ORAL | Status: AC

## 2012-06-20 NOTE — Progress Notes (Signed)
ANTICOAGULATION CONSULT NOTE - Initial Consult  Pharmacy Consult for Coumadin Indication: atrial fibrillation and VTE prophylaxis s/p L hip hemiarthroplasty  No Known Allergies  Patient Measurements:   Ht ~ 72 inches Wt ~ 53.4 kg  Vital Signs: Temp: 97.3 F (36.3 C) (04/11 0600) BP: 178/85 mmHg (04/11 0600) Pulse Rate: 100 (04/11 0600)  Labs:  Recent Labs  06/18/12 0900 06/18/12 1716 06/19/12 0630 06/20/12 0425  HGB 12.1*  --  11.3* 10.8*  HCT 34.3*  --  32.5* 30.7*  PLT 350  --  341 381  LABPROT  --  19.3* 18.5* 27.9*  INR  --  1.69* 1.59* 2.77*  CREATININE 1.14  --  1.03 1.14   CrCl ~ 45 ml/hr  Medical History: Past Medical History  Diagnosis Date  . HTN (hypertension)   . Dementia   . History of CVA (cerebrovascular accident)     no residual deficit (this apparently occured shortly after coming off of aspirin for cataract surgery.  Of note, old records show MRI head w/out contrast 07/2010 showed chronic small vessel infarctions w/in the pons + chronic small vessel dz throughout the brain.  . Atrial fibrillation   . Hearing impairment in [facility] 2    Bilateral; [facility] but [facility] won't wear them  . Colon polyp 2002  . History of dizziness   . Elevated PSA 2012    PSA 28.13 Apr 2010: Urology eval (Dr. Cleone Slim)  . SCC (squamous cell carcinoma), leg 2010    left  . History of rib fracture 2006    4 on right; nondisplaced (s/p lawn mower fell on him)  . Vertebral compression fracture     30% anterior wedge compression deformity T11    Medications:  Prescriptions prior to admission  Medication Sig Dispense Refill  . digoxin (LANOXIN) 0.125 MG tablet Take 0.125 mg by mouth daily.       Marland Kitchen donepezil (ARICEPT) 10 MG tablet Take 10 mg by mouth at bedtime.      Marland Kitchen lisinopril-hydrochlorothiazide (PRINZIDE,ZESTORETIC) 20-12.5 MG per tablet Take 1 tablet by mouth daily.      . megestrol (MEGACE) 40 MG/ML suspension Take 200 mg by mouth daily.      Marland Kitchen warfarin  (COUMADIN) 2.5 MG tablet Take 1.25-2.5 mg by mouth daily. Takes 2.5 mg on Monday and Thursday. Takes 1.25 mg all other days.        Assessment: 79 yo M on Coumadin PTA for hx afib.  Pt fell PTA and presented for L hip surgery (4/10).  INR was reversed with 10 mg IV Vitamin K (4/9).  INR therapeutic today but big jump 1.59 > 2.77.  Home Coumadin dose = 1.25 mg daily except 2.5mg  Mon and Thurs.   Goal of Therapy:  INR 2-3 Monitor platelets by anticoagulation protocol: Yes   Plan:  Hold coumadin today Daily INR.  Bayard Hugger, PharmD, BCPS  Clinical Pharmacist  Pager: [phone]   06/20/2012 10:36 AM

## 2012-06-20 NOTE — Progress Notes (Signed)
SW spoke with [facility] and they are expecting the patient to be d/c'd 06/21/12. SW sent Discharge summary and updated patient's son on the discharge date. Sw will make weekend SW aware of d/c.  Sabino Niemann, MSW [phone]

## 2012-06-20 NOTE — Progress Notes (Signed)
Subjective: 1 Day Post-Op Procedure(s) (LRB): Left hip hemiarthroplasty (Left) Seems to have much less pain than prior to surgery.  Vitals stable with acute blood loss anemia.  Objective: Vital signs in last 24 hours: Temp:  [97 F (36.1 C)-99.9 F (37.7 C)] 97.3 F (36.3 C) (04/11 0600) Pulse Rate:  [71-100] 100 (04/11 0600) Resp:  [16-28] 16 (04/11 0600) BP: (165-178)/(75-104) 178/85 mmHg (04/11 0600) SpO2:  [95 %-100 %] 95 % (04/11 0600)  Intake/Output from previous day: 04/10 0701 - 04/11 0700 In: 1320 [P.O.:120; I.V.:1200] Out: 1350 [Urine:1250; Blood:100] Intake/Output this shift:     Recent Labs  06/18/12 0401 06/18/12 0900 06/19/12 0630 06/20/12 0425  HGB 12.6* 12.1* 11.3* 10.8*    Recent Labs  06/19/12 0630 06/20/12 0425  WBC 13.7* 14.3*  RBC 3.93* 3.70*  HCT 32.5* 30.7*  PLT 341 381    Recent Labs  06/19/12 0630 06/20/12 0425  NA 136 137  K 3.1* 3.6  CL 104 105  CO2 23 23  BUN 20 21  CREATININE 1.03 1.14  GLUCOSE 102* 121*  CALCIUM 8.3* 8.3*    Recent Labs  06/19/12 0630 06/20/12 0425  INR 1.59* 2.77*    Intact pulses distally Incision: dressing C/D/I  Assessment/Plan: 1 Day Post-Op Procedure(s) (LRB): Left hip hemiarthroplasty (Left) Up with therapy Discharge to SNF when bed available (ok from ortho standpoint) Back on Coumadin  Ruffus Kamaka Y 06/20/2012, 7:03 AM

## 2012-06-20 NOTE — Discharge Summary (Addendum)
Physician Discharge Summary  Alexander Lyons NWG:[id number] DOB: 01/24/1917 DOA: 06/18/2012  PCP: Jeoffrey Massed, MD  Admit date: 06/18/2012 Discharge date: 06/21/2012  Time spent: 40 minutes  Recommendations for Outpatient Follow-up:  1. Return to SNF 2. Full weight bearing as tolerated.  follow up with orthopedics in 2 weeks 3. Hold coumadin on 4/12 and 4/13 asit is suprathertapeutic at 3.78. Recheck INR ON 4/14 and resume coumadin at 1 mg daily.  Discharge Diagnoses:   Principal Problem:   Closed left hip fracture  Active Problems:   HTN (hypertension)   Dementia   A-fib on Coumadin   Hypokalemia   Supratherapeutic INR   Discharge Condition: fair  Diet recommendation: low sodium  There were no vitals filed for this visit.  History of present illness:  Please refer to admission H&P for details, but in brief, 79 y.o. male with history of advanced dementia, atrial fibrillation, hypertension who was recently admitted for dehydration and hypotension was brought to the ER with persistent pain in the left hip area after sustaining a fall. Imaging showed a left femoral neck fracture. Patient also had a supra therapeutic INR.    Hospital Course:  Left femoral neck fracture  S/p mechanical fall. Family wished for surgery as patient was ambulating prior to fall and with pain will have an impaired quality of life. Had left hip arthroplasty on 4/10. Tolerated procedure well.  -continue pain control and weith bearing as tolerated.   Afib  on coumadin with supratheaperutic INR  Coumadin held and a dose of vitamin k given. INR improved on 4/10. Rate controlled. continue digoxin.  Coumadin resumed post op. INR now therapeutic  HTN  Resume home meds.  Advanced dementia  Continue aricept   Hypokalemia  replenished with iv kcl    Code Status: full code  Family Communication: Discussed with son at bedside Disposition Plan: d/c to SNF   Consultants:  Ortho ( Dr  Magnus Ivan) Procedures:  For left hemiarthroplasty on 4/10    Discharge Exam: Filed Vitals:   06/20/12 0600 06/20/12 0800 06/20/12 1200 06/20/12 1300  BP: 178/85   162/76  Pulse: 100     Temp: 97.3 F (36.3 C)   100.7 F (38.2 C)  TempSrc:      Resp: 16 18 18 18   SpO2: 95% 100%  98%   General: elderly male lying in bed in NAD  HEENT: no pallor, moist mucosa  Chest: clear b/l  CVS: N S1&S2  Abd: soft, BS,+, non tender, non distended  Ext: warm, dressing over left hip surgical site clean CNS: AAOX0   Discharge Instructions  Discharge Orders   Future Appointments Provider Department Dept Phone   06/26/2012 1:15 PM Jeoffrey Massed, MD [facility] [place] [phone]   Future Orders Complete By Expires     Full weight bearing  As directed     Scheduling Instructions:      Full weight left hip        Medication List    TAKE these medications       acetaminophen-codeine 300-30 MG per tablet  Commonly known as:  TYLENOL #3  Take 1-2 tablets by mouth every 4 (four) hours as needed for pain.     digoxin 0.125 MG tablet  Commonly known as:  LANOXIN  Take 0.125 mg by mouth daily.     donepezil 10 MG tablet  Commonly known as:  ARICEPT  Take 10 mg by mouth at bedtime.  DSS 100 MG Caps  Take 100 mg by mouth 2 (two) times daily.     HYDROcodone-acetaminophen 5-325 MG per tablet  Commonly known as:  NORCO/VICODIN  Take 1 tablet by mouth every 6 (six) hours as needed for pain.     lisinopril-hydrochlorothiazide 20-12.5 MG per tablet  Commonly known as:  PRINZIDE,ZESTORETIC  Take 2 tablet by mouth daily. ( dose increased upon discharge)      megestrol 40 MG/ML suspension  Commonly known as:  MEGACE  Take 200 mg by mouth daily.     methocarbamol 500 MG tablet  Commonly known as:  ROBAXIN  Take 1 tablet (500 mg total) by mouth every 6 (six) hours as needed (Muscle spasms).     Warfarin 1 mg po daily  ( please hold dose for today and tomorrow and  restart on 4/14 after INR checked.            Follow-up Information   Follow up In 2 weeks.      Follow up with MCGOWEN,PHILIP H, MD In 1 week.   Contact information:   [address] [place] [address] [phone]       Follow up with Kathryne Hitch, MD In 2 weeks.   Contact information:   [address] [place] [address] [phone]        The results of significant diagnostics from this hospitalization (including imaging, microbiology, ancillary and laboratory) are listed below for reference.    Significant Diagnostic Studies: Dg Chest 1 View  06/18/2012  *RADIOLOGY REPORT*  Clinical Data: Status post fall; concern for chest injury.  CHEST - 1 VIEW  Comparison: Chest radiograph performed 06/14/2012  Findings: The lungs are well-aerated.  Mild bibasilar airspace opacities could reflect mild interstitial edema; vascular congestion has improved.  There is mild elevation of the right hemidiaphragm.  No definite pleural effusion or pneumothorax is seen.  The cardiomediastinal silhouette is enlarged; calcification is noted within the aortic arch.  No acute osseous abnormalities are seen.  IMPRESSION:  1.  Mild bibasilar airspace opacities could reflect mild interstitial edema; vascular congestion has improved. 2.  Cardiomegaly noted.   Original Report Authenticated By: Tonia Ghent, M.D.    Dg Chest 2 View  06/14/2012  *RADIOLOGY REPORT*  Clinical Data: Altered mental status.  CHEST - 2 VIEW  Comparison: None.  Findings: The heart is enlarged.  There is a hiatal hernia.  Mildly prominent interstitial markings are identified.  There is probable mild interstitial edema.  Aorta is tortuous and partially calcified.  Small bilateral pleural effusions are present.  Note is made of a wedge compression fracture in the lower thoracic spine, likely T12.  The age is indeterminate.  There is mild wedging of other thoracic levels, the chronicity of which is not known.   IMPRESSION:  1.  Cardiomegaly and mild interstitial edema. 2.  Bilateral small pleural effusions. 3.  Wedge compression fracture of T12, age indeterminate.   Original Report Authenticated By: Norva Pavlov, M.D.    Dg Hip Complete Left  06/18/2012  *RADIOLOGY REPORT*  Clinical Data: Status post fall; left hip pain.  LEFT HIP - COMPLETE 2+ VIEW  Comparison: None.  Findings: There is a mildly displaced subcapital fracture through the left femoral neck.  No additional fractures are seen.  The right hip is unremarkable in appearance.  No significant degenerative change is appreciated.  The sacroiliac joints are unremarkable in appearance.  The visualized bowel gas pattern is grossly unremarkable  in appearance.  Scattered vascular calcifications are seen.  IMPRESSION:  1.  Mildly displaced subcapital fracture through the left femoral neck. 2.  Scattered vascular calcifications seen.   Original Report Authenticated By: Tonia Ghent, M.D.    Dg Femur Left  06/18/2012  *RADIOLOGY REPORT*  Clinical Data: Status post fall; left hip pain.  LEFT FEMUR - 2 VIEW  Comparison: None.  Findings: There is a displaced subcapital left femoral neck fracture.  The left femoral head remains seated at the acetabulum. No additional fractures are seen.  The degree of displacement varies depending on positioning.  The remainder of the left femur appears intact.  An enthesophyte is noted arising at the superior pole of the patella.  Scattered vascular calcifications are seen.  No additional soft tissue abnormalities are characterized.  IMPRESSION:  1.  Displaced subcapital left femoral neck fracture noted. 2.  Scattered vascular calcifications seen.   Original Report Authenticated By: Tonia Ghent, M.D.    Ct Head Wo Contrast  06/18/2012  *RADIOLOGY REPORT*  Clinical Data: Unwitnessed fall; concern for head injury.  Patient on Coumadin.  CT HEAD WITHOUT CONTRAST  Technique:  Contiguous axial images were obtained from the base of the  skull through the vertex without contrast.  Comparison: CT of the head performed 06/14/2012, and MRI of the brain performed 07/12/2010  Findings: There is no evidence of acute infarction, mass lesion, or intra- or extra-axial hemorrhage on CT.  Prominence of the ventricles and sulci reflects moderate cortical volume loss.  Cerebellar atrophy is noted.  Diffuse periventricular and subcortical white matter change likely reflects small vessel ischemic microangiopathy.  The brainstem and fourth ventricle are within normal limits.  The basal ganglia are unremarkable in appearance.  The cerebral hemispheres demonstrate grossly normal gray-white differentiation. No mass effect or midline shift is seen.  There is no evidence of fracture; visualized osseous structures are unremarkable in appearance.  The orbits are within normal limits. There is partial opacification of the left maxillary sinus; the patient is status post left maxillary antrostomy and left-sided mastoidectomy.  The remaining paranasal sinuses and right mastoid air cells are well-aerated.  No significant soft tissue abnormalities are seen.  IMPRESSION:  1.  No evidence of traumatic intracranial injury or fracture. 2.  Moderate cortical volume loss and diffuse small vessel ischemic microangiopathy. 3.  Partial opacification of the left maxillary sinus.   Original Report Authenticated By: Tonia Ghent, M.D.    Ct Head Wo Contrast  06/14/2012  *RADIOLOGY REPORT*  Clinical Data: Decreased oral intake for 4 days.  No other complaints.  CT HEAD WITHOUT CONTRAST  Technique:  Contiguous axial images were obtained from the base of the skull through the vertex without contrast.  Comparison: None.  Findings: There is significant central cortical atrophy. Periventricular white matter changes are consistent with small vessel disease. There is no evidence for hemorrhage, mass lesion, or acute infarction.  Bone windows show no calvarial fracture.  There is  atherosclerotic calcification of the internal carotid arteries.  Mild mucoperiosteal thickening of the maxillary and ethmoid air cells.  IMPRESSION:  1.  Atrophy and small vessel disease. 2. No evidence for acute intracranial abnormality. 3.  Chronic sinusitis.   Original Report Authenticated By: Norva Pavlov, M.D.    US Renal  05/23/2012  *RADIOLOGY REPORT*  Clinical Data: Acute renal failure.  Question obstruction?  High blood pressure.  RENAL/URINARY TRACT ULTRASOUND COMPLETE  Comparison:  None.  Findings:  Right Kidney:  10.2 cm.  No hydronephrosis.  Upper pole 6.5 cm complex cyst with nodularity/septation.  Renal parenchymal thinning.  Left Kidney:  10.1 cm. No hydronephrosis or renal mass. Renal parenchymal thinning.  Bladder:  Bilateral ureteral jets noted.  Enlarged lobulated prostate gland indents the bladder base.  IMPRESSION: No evidence of hydronephrosis.  Complex 6.5 cm right renal cyst with nodularity/septation. Recommend follow-up renal sonogram in 6 months.  Renal parenchymal thinning bilaterally.  Enlarged lobulated prostate gland.  Clinical and laboratory correlation recommended.   Original Report Authenticated By: Lacy Duverney, M.D.    Dg Pelvis Portable  06/19/2012  *RADIOLOGY REPORT*  Clinical Data: Hip replacement.  PORTABLE PELVIS  Comparison: Plain films 06/18/2012.  Findings: Bipolar left hip hemiarthroplasty is in place.  The device is located and there is no fracture.  Gas in the soft tissues and surgical staples are noted.  IMPRESSION: Left hip replacement without complicating feature.  No acute finding.   Original Report Authenticated By: Holley Dexter, M.D.     Microbiology: Recent Results (from the past 240 hour(s))  URINE CULTURE     Status: None   Collection Time    06/18/12  6:48 AM      Result Value Range Status   Specimen Description URINE, RANDOM   Final   Special Requests NONE   Final   Culture  Setup Time 06/18/2012 06:48   Final   Colony Count NO GROWTH    Final   Culture NO GROWTH   Final   Report Status 06/19/2012 FINAL   Final  SURGICAL PCR SCREEN     Status: Abnormal   Collection Time    06/19/12  9:14 AM      Result Value Range Status   MRSA, PCR POSITIVE (*) NEGATIVE Final   Staphylococcus aureus POSITIVE (*) NEGATIVE Final   Comment:            The Xpert SA Assay (FDA     approved for NASAL specimens     in patients over 48 years of age),     is one component of     a comprehensive surveillance     program.  Test performance has     been validated by [employer] for patients greater     than or equal to 80 year old.     It is not intended     to diagnose infection nor to     guide or monitor treatment.     Labs: Basic Metabolic Panel:  Recent Labs Lab 06/14/12 2125 06/18/12 0401 06/18/12 0900 06/19/12 0630 06/20/12 0425  NA 136 139 139 136 137  K 4.6 3.2* 3.0* 3.1* 3.6  CL 104 101 104 104 105  CO2 21 24 24 23 23   GLUCOSE 99 140* 128* 102* 121*  BUN 26* 28* 27* 20 21  CREATININE 1.18 1.27 1.14 1.03 1.14  CALCIUM 9.1 8.9 8.5 8.3* 8.3*   Liver Function Tests:  Recent Labs Lab 06/14/12 2125 06/18/12 0900  AST 21 16  ALT 8 11  ALKPHOS 47 45  BILITOT 0.7 0.7  PROT 8.2 7.2  ALBUMIN 3.5 2.9*    Recent Labs Lab 06/14/12 2125  LIPASE 26   No results found for this basename: AMMONIA,  in the last 168 hours CBC:  Recent Labs Lab 06/14/12 2125 06/18/12 0401 06/18/12 0900 06/19/12 0630 06/20/12 0425  WBC 10.9* 15.7* 14.5* 13.7* 14.3*  NEUTROABS 7.7 12.4* 11.1*  --   --   HGB 13.8 12.6* 12.1* 11.3*  10.8*  HCT 39.3 36.3* 34.3* 32.5* 30.7*  MCV 85.4 83.1 82.9 82.7 83.0  PLT 369 352 350 341 381   Cardiac Enzymes: No results found for this basename: CKTOTAL, CKMB, CKMBINDEX, TROPONINI,  in the last 168 hours BNP: BNP (last 3 results) No results found for this basename: PROBNP,  in the last 8760 hours CBG: No results found for this basename: GLUCAP,  in the last 168  hours     Signed:  Eddie North  [facility] 06/20/2012, 2:13 PM

## 2012-06-20 NOTE — Evaluation (Signed)
Physical Therapy Evaluation Patient Details Name: Alexander Lyons MRN: [id number] DOB: 1916-03-19 Today's Date: 06/20/2012 Time: 1478-2956 PT Time Calculation (min): 23 min  PT Assessment / Plan / Recommendation Clinical Impression  Pt is a 79 y.o. ,male s/p  L hemiarthroplasty secondary to fall and fx. Pt presents with cognitive deficits and has history of dementia. Pt responds inconsistently to questions and commands. Required 2+ A for all mobility and transfers. Would benefit from skilled PT to improve mobility and increase overall strength and activity tolerance. Recommend SNF upon acute D/C.     PT Assessment  Patient needs continued PT services    Follow Up Recommendations  SNF    Does the patient have the potential to tolerate intense rehabilitation      Barriers to Discharge        Equipment Recommendations  None recommended by PT    Recommendations for Other Services     Frequency Min 3X/week    Precautions / Restrictions Precautions Precautions: Fall;Anterior Hip Precaution Booklet Issued: No Restrictions Weight Bearing Restrictions: Yes LLE Weight Bearing: Weight bearing as tolerated   Pertinent Vitals/Pain Denied pain initially. Indicated pain with movement of L LE with facial grimace and moaning; unable to rate pain on 0-10 scale; pt repositioned in chair.        Mobility  Bed Mobility Bed Mobility: Supine to Sit;Sitting - Scoot to Edge of Bed Supine to Sit: 1: +2 Total assist Supine to Sit: Patient Percentage: 0% Sitting - Scoot to Edge of Bed: 1: +2 Total assist Sitting - Scoot to Edge of Bed: Patient Percentage: 30% Details for Bed Mobility Assistance: required constant cues for sequencing; demo decreased ability to follow commands; required 2+ assist to advance LEs off EOB and to assist trunk into upright position. pt has tendency to lean to the R or posteriorly while sitting on EOB; requires min A to maintain sitting EOB.  Transfers Transfers: Sit  to Stand;Stand to Sit;Stand Pivot Transfers Sit to Stand: 1: +2 Total assist;From bed;From elevated surface Sit to Stand: Patient Percentage: 10% Stand to Sit: 1: +2 Total assist;To chair/3-in-1;With upper extremity assist Stand to Sit: Patient Percentage: 10% Stand Pivot Transfers: 1: +2 Total assist;From elevated surface Stand Pivot Transfers: Patient Percentage: 10% Details for Transfer Assistance: pt required 2+ A for transfers; has a tendency to lean posteriorly and resist transfers; pt unable to follow commands to use RW for transfers; requires continuous vc's and encouragement to participate; Ambulation/Gait Ambulation/Gait Assistance: Not tested (comment) Stairs: No Wheelchair Mobility Wheelchair Mobility: No    Exercises     PT Diagnosis: Difficulty walking;Acute pain;Generalized weakness  PT Problem List: Decreased strength;Decreased range of motion;Decreased activity tolerance;Decreased balance;Decreased mobility;Decreased knowledge of use of DME;Decreased safety awareness;Decreased knowledge of precautions;Pain PT Treatment Interventions: DME instruction;Functional mobility training;Therapeutic activities;Therapeutic exercise;Gait training;Patient/family education;Balance training;Neuromuscular re-education   PT Goals Acute Rehab PT Goals PT Goal Formulation: With patient Time For Goal Achievement: 06/24/12 Potential to Achieve Goals: Fair Pt will go Supine/Side to Sit: with min assist PT Goal: Supine/Side to Sit - Progress: Goal set today Pt will Sit at Edge of Bed: with min assist PT Goal: Sit at Edge Of Bed - Progress: Goal set today Pt will go Sit to Supine/Side: with min assist PT Goal: Sit to Supine/Side - Progress: Goal set today Pt will go Sit to Stand: with min assist PT Goal: Sit to Stand - Progress: Goal set today Pt will go Stand to Sit: with min assist PT Goal: Stand to  Sit - Progress: Goal set today Pt will Transfer Bed to Chair/Chair to Bed: with min  assist PT Transfer Goal: Bed to Chair/Chair to Bed - Progress: Goal set today Pt will Ambulate: 16 - 50 feet;with min assist;with rolling walker PT Goal: Ambulate - Progress: Goal set today  Visit Information  Last PT Received On: 06/20/12 Assistance Needed: +2    Subjective Data  Subjective: agreeable to therapy   Prior Functioning  Home Living Available Help at Discharge: Skilled Nursing Facility Prior Function Level of Independence:  (unclear; pt is a poor historian and no family present) Driving: No Communication Communication: No difficulties    Cognition  Cognition Overall Cognitive Status: Impaired Area of Impairment: Attention;Memory;Following commands;Safety/judgement;Problem solving Arousal/Alertness: Awake/alert Orientation Level: Disoriented to;Time;Situation;Place Behavior During Session: [facility] for tasks performed Current Attention Level: Focused Attention - Other Comments: very distractable; requires constant cues to stay focused; attention less than 20 seconds  Memory: Decreased recall of precautions Memory Deficits: difficult to assess secondary to decreased verbal communication  Following Commands: Follows one step commands with increased time;Follows one step commands inconsistently Safety/Judgement: Decreased awareness of safety precautions;Decreased awareness of need for assistance (at EOB pt stated "I can stand by myself" repeatedly ) Safety/Judgement - Other Comments: decreased safety awareness; rquired constant cues for safety Problem Solving: unable to sequence hand placement on rails; required constant multimodal cues Cognition - Other Comments: unable to determine baseline; pt hx of dementia    Extremity/Trunk Assessment Right Lower Extremity Assessment RLE ROM/Strength/Tone: Within functional levels (able to perform LAQ at side of bed with multicues ) RLE Sensation:  (difficult to assess; decreased congnition) Left Lower Extremity Assessment LLE  ROM/Strength/Tone: Due to pain;Unable to fully assess;Due to impaired cognition LLE Sensation:  (could make grimmaces indicating pain with movement )   Balance Balance Balance Assessed: Yes Static Sitting Balance Static Sitting - Balance Support: Bilateral upper extremity supported;Feet supported Static Sitting - Level of Assistance: 4: Min assist  End of Session PT - End of Session Equipment Utilized During Treatment: Gait belt Activity Tolerance: Patient tolerated treatment well Patient left: in chair;with call bell/phone within reach;with chair alarm set Nurse Communication: Mobility status;Precautions  GP     Donell Sievert, [place] [phone] 06/20/2012, 10:17 AM

## 2012-06-20 NOTE — Progress Notes (Signed)
Occupational Therapy Discharge Patient Details Name: BOOKERT GUZZI MRN: [id number] DOB: December 14, 1916 Today's Date: 06/20/2012 Time:  -     Patient discharged from OT services secondary to pt to be D/C'd today, will defer OT eval to the SNF that she is going to . Acute OT will sign off..  Please see latest therapy progress note for current level of functioning and progress toward goals.           Evette Georges [phone] 06/20/2012, 4:56 PM

## 2012-06-20 NOTE — Op Note (Signed)
NAMEEDRIC, FETTERMAN NO.:  [id number]  MEDICAL RECORD NO.:  [id number]  LOCATION:  [id number]                        FACILITY:  [facility]  PHYSICIAN:  Vanita Panda. Magnus Ivan, M.D.DATE OF BIRTH:  Jul 25, 1916  DATE OF PROCEDURE: DATE OF DISCHARGE:                              OPERATIVE REPORT   PREOPERATIVE DIAGNOSIS:  Displaced left hip femoral neck fracture.  POSTOPERATIVE DIAGNOSIS:  Displaced left hip femoral neck fracture.  PROCEDURE:  Left hip hemiarthroplasty.  IMPLANTS:  DePuy Summit Basic press-fit stem size 6, size 53 unipolar head with -3 taper spacer.  SURGEON:  Vanita Panda. Magnus Ivan, M.D.  ASSISTANT:  Richardean Canal, PA-C.  ANESTHESIA:  General.  ANTIBIOTICS:  2 g of IV Ancef.  BLOOD LOSS:  100 mL or less.  COMPLICATIONS:  None.  INDICATIONS:  Mr. Hurrell is a 79 year old demented nursing home resident who is an ambulator.  He sustained a mechanical fall the night before last and was seen at the [facility] Emergency Room.  He was found to have a displaced femoral neck fracture.  He does sense pain and responding to the severity of his pain, even sitting up was causing significant issues.  The family did wish to proceed with a left hip hemiarthroplasty to treat this fracture in spite of the risks and benefits involved because the quality of life is definitely an issue. The risks and benefits of this were explained in detail with the family and they did wish to proceed.  PROCEDURE DESCRIPTION:  After informed consent was obtained, appropriate left hip was marked.  He was brought to the operating room.  General anesthesia was obtained while he was on the stretcher and he was placed supine on the table and then turned in lateral decubitus position with left operative hip up, padding down nonoperative right leg and axillary rolls placed as well as appropriate padding of the head and neck.  We then prepped his left operative hip with DuraPrep and  sterile drapes. Time-out was called to identify correct patient, correct left hip.  We then made incision directly over the greater trochanter and carried this proximally and distally and dissected down to the iliotibial band.  The iliotibial band was then divided longitudinally.  We placed a Charnley retractor within the iliotibial band.  Next, we proceeded with an anterolateral approach to the hip.  I took down a sleeve of the gluteus medius and minimus tendon off the greater trochanter and retracted these anteriorly.  I then exposed the hip capsule and opened the hip capsule and found a large hematoma and a completely displaced femoral neck fracture.  Using oscillating saw, I made my femoral neck cut and proximal to the lesser trochanter and completed this with an osteotome. I removed the femoral head in its entirety and we measured appropriate head size to be a 53.  We cleaned the acetabulum of debris, and I proceeded with a femoral component placement.  With the leg flexed and externally rotated off the table into the leg bag, we entered the femoral canal using initiating reamer and then canal finding guide.  I then used lateralizing reamer as well.  We then used #1 broach all the way up  to a size 6 and size 6 broach was the most stable.  We trialed a -3 taper spacer and 33 head and reduced this in the acetabulum and it was stable throughout its arc of motion with no instability that I could sense.  His leg lengths were equal.  We then dislocated the trial components and placed the real Summit Basic press-fit Stem from DePuy size 6 with the real unipolar 53 head with a -3 spacer and reduced this back in the acetabulum, again it was stable.  We copiously irrigated the soft tissue with normal saline solution.  We closed the joint capsule with interrupted #1 Ethibond suture.  I reapproximated the gluteus tendons and minimus and medius tendons to the greater trochanter and oversew this  with #1 Ethibond suture.  I closed the iliotibial band with interrupted #1 Vicryl suture, 2-0 Vicryl in the deep and subcutaneous tissue and interrupted staples for the skin.  Xeroform and a well-padded sterile dressing was applied.  He was rolled into supine position, awakened, extubated, and taken to the recovery room in stable condition. All final counts were correct.  There were no complications noted.  Of note, Kriste Basque, PA-C was present during the entire case and his presence was crucial with positioning the patient, exposure of the hip joint, retracting, and closure of the wound.     Vanita Panda. Magnus Ivan, M.D.     CYB/MEDQ  D:  06/19/2012  T:  06/20/2012  Job:  [id number]

## 2012-06-21 DIAGNOSIS — S72009A Fracture of unspecified part of neck of unspecified femur, initial encounter for closed fracture: Secondary | ICD-10-CM

## 2012-06-21 DIAGNOSIS — I10 Essential (primary) hypertension: Secondary | ICD-10-CM

## 2012-06-21 DIAGNOSIS — I4891 Unspecified atrial fibrillation: Secondary | ICD-10-CM

## 2012-06-21 LAB — CBC
HCT: 29.5 % — ABNORMAL LOW (ref 39.0–52.0)
Hemoglobin: 10.2 g/dL — ABNORMAL LOW (ref 13.0–17.0)
MCH: 28.6 pg (ref 26.0–34.0)
MCHC: 34.6 g/dL (ref 30.0–36.0)
RBC: 3.57 MIL/uL — ABNORMAL LOW (ref 4.22–5.81)

## 2012-06-21 LAB — BASIC METABOLIC PANEL
BUN: 22 mg/dL (ref 6–23)
CO2: 24 mEq/L (ref 19–32)
GFR calc non Af Amer: 69 mL/min — ABNORMAL LOW (ref >90)
Glucose, Bld: 130 mg/dL — ABNORMAL HIGH (ref 70–99)
Potassium: 3.7 mEq/L (ref 3.5–5.1)

## 2012-06-21 MED ORDER — WARFARIN SODIUM 1 MG PO TABS: 1.0000 mg | ORAL_TABLET | ORAL | Status: AC

## 2012-06-21 MED ORDER — LISINOPRIL 20 MG PO TABS
20.0000 mg | ORAL_TABLET | Freq: Every day | ORAL | Status: DC
  Administered 2012-06-21: 20 mg via ORAL
  Filled 2012-06-21 (×2): qty 1

## 2012-06-21 MED ORDER — HYDROCHLOROTHIAZIDE 12.5 MG PO CAPS
25.0000 mg | ORAL_CAPSULE | Freq: Every day | ORAL | Status: DC
  Administered 2012-06-21: 25 mg via ORAL
  Filled 2012-06-21 (×2): qty 2

## 2012-06-21 MED ORDER — LISINOPRIL-HYDROCHLOROTHIAZIDE 20-12.5 MG PO TABS: 2.0000 | ORAL_TABLET | Freq: Every day | ORAL | Status: AC

## 2012-06-21 NOTE — Clinical Social Work Note (Signed)
Patient to return to [facility] today via [facility]. Patient's son and facility aware of transfer. Discharge packet complete. CSW signing off.  Ricke Hey, [place] [phone] (weekend)

## 2012-06-21 NOTE — Progress Notes (Signed)
[facility] PROGRESS NOTE  Alexander Lyons RUE:[id number] DOB: 1916/05/28 DOA: 06/18/2012 PCP: Jeoffrey Massed, MD   Please refer to admission H&P for details, but in brief, 79 y.o. male with history of advanced dementia, atrial fibrillation, hypertension who was recently admitted for dehydration and hypotension was brought to the ER with persistent pain in the left hip area after sustaining a fall. Imaging showed a left femoral neck fracture. Patient also had a supra therapeutic INR.    Assessment/ plan Left femoral neck fracture  S/p mechanical fall. Family wished for surgery as patient was ambulating prior to fall and with pain will have an impaired quality of life. Had left hip arthroplasty on 4/10. Tolerated procedure well.  -continue pain control and weith bearing as tolerated.   Afib  on coumadin with supratheaperutic INR  Coumadin held and a dose of vitamin k given. INR improved on 4/10. Rate controlled. continue digoxin.  Coumadin resumed post op. INR again supratherapeutic . Will plan to hold coumadin today and tomorrow and restart on 4/14 after INR checked   Fever on 4/11 Possibly post op. UA and CXR normal. leucocytosis possibly stress induced as well. Monitor in SNF  HTN  BP elevated. Possibly due to pain as well. Increased pain meds  Advanced dementia  Continue aricept  Hypokalemia  replenished with iv kcl     Stable for d/c to SNF   Antibiotics:  none  HPI/Subjective: No overnight issues. remains afebrile  Objective: Filed Vitals:   06/20/12 2124 06/21/12 0000 06/21/12 0536 06/21/12 0729  BP: 154/94  160/94 168/80  Pulse: 79  89   Temp: 98.6 F (37 C)  97.3 F (36.3 C)   TempSrc:      Resp: 16 18 16    SpO2: 99% 98% 98%     Intake/Output Summary (Last 24 hours) at 06/21/12 1045 Last data filed at 06/21/12 0900  Gross per 24 hour  Intake    240 ml  Output    225 ml  Net     15 ml   There were no vitals filed for this  visit.  Exam: General: elderly male lying in bed in NAD HEENT: no pallor, moist mucosa Chest: clear b/l CVS: N S1&S2 Abd: soft, BS,+, non tender, non distended Ext: warm, dressing over left hip surgical site clean CNS: AAOX0   Data Reviewed: Basic Metabolic Panel:  Recent Labs Lab 06/18/12 0401 06/18/12 0900 06/19/12 0630 06/20/12 0425 06/21/12 0648  NA 139 139 136 137 137  K 3.2* 3.0* 3.1* 3.6 3.7  CL 101 104 104 105 105  CO2 24 24 23 23 24   GLUCOSE 140* 128* 102* 121* 130*  BUN 28* 27* 20 21 22   CREATININE 1.27 1.14 1.03 1.14 0.96  CALCIUM 8.9 8.5 8.3* 8.3* 8.5   Liver Function Tests:  Recent Labs Lab 06/14/12 2125 06/18/12 0900  AST 21 16  ALT 8 11  ALKPHOS 47 45  BILITOT 0.7 0.7  PROT 8.2 7.2  ALBUMIN 3.5 2.9*    Recent Labs Lab 06/14/12 2125  LIPASE 26   No results found for this basename: AMMONIA,  in the last 168 hours CBC:  Recent Labs Lab 06/14/12 2125 06/18/12 0401 06/18/12 0900 06/19/12 0630 06/20/12 0425 06/21/12 0648  WBC 10.9* 15.7* 14.5* 13.7* 14.3* 14.6*  NEUTROABS 7.7 12.4* 11.1*  --   --   --   HGB 13.8 12.6* 12.1* 11.3* 10.8* 10.2*  HCT 39.3 36.3* 34.3* 32.5* 30.7* 29.5*  MCV 85.4 83.1  82.9 82.7 83.0 82.6  PLT 369 352 350 341 381 390   Cardiac Enzymes: No results found for this basename: CKTOTAL, CKMB, CKMBINDEX, TROPONINI,  in the last 168 hours BNP (last 3 results) No results found for this basename: PROBNP,  in the last 8760 hours CBG: No results found for this basename: GLUCAP,  in the last 168 hours  Recent Results (from the past 240 hour(s))  URINE CULTURE     Status: None   Collection Time    06/18/12  6:48 AM      Result Value Range Status   Specimen Description URINE, RANDOM   Final   Special Requests NONE   Final   Culture  Setup Time 06/18/2012 06:48   Final   Colony Count NO GROWTH   Final   Culture NO GROWTH   Final   Report Status 06/19/2012 FINAL   Final  SURGICAL PCR SCREEN     Status: Abnormal    Collection Time    06/19/12  9:14 AM      Result Value Range Status   MRSA, PCR POSITIVE (*) NEGATIVE Final   Staphylococcus aureus POSITIVE (*) NEGATIVE Final   Comment:            The Xpert SA Assay (FDA     approved for NASAL specimens     in patients over 62 years of age),     is one component of     a comprehensive surveillance     program.  Test performance has     been validated by [employer] for patients greater     than or equal to 2 year old.     It is not intended     to diagnose infection nor to     guide or monitor treatment.     Studies: Dg Pelvis Portable  06/19/2012  *RADIOLOGY REPORT*  Clinical Data: Hip replacement.  PORTABLE PELVIS  Comparison: Plain films 06/18/2012.  Findings: Bipolar left hip hemiarthroplasty is in place.  The device is located and there is no fracture.  Gas in the soft tissues and surgical staples are noted.  IMPRESSION: Left hip replacement without complicating feature.  No acute finding.   Original Report Authenticated By: Holley Dexter, M.D.    Dg Chest Port 1 View  06/20/2012  *RADIOLOGY REPORT*  Clinical Data: Fever.  PORTABLE CHEST - 1 VIEW  Comparison: Single view of the chest 06/18/2012.  Findings: The patient is rotated on the study.  There is cardiomegaly.  Bibasilar airspace opacities have an appearance most consistent with atelectasis.  No pneumothorax or pleural effusion.  IMPRESSION: Bibasilar atelectasis.  Cardiomegaly without edema.   Original Report Authenticated By: Holley Dexter, M.D.     Scheduled Meds: . Chlorhexidine Gluconate Cloth  6 each Topical Q0600  . digoxin  0.125 mg Oral Daily  . docusate sodium  100 mg Oral BID  . donepezil  10 mg Oral QHS  . lisinopril  20 mg Oral Daily   And  . hydrochlorothiazide  25 mg Oral Daily  . megestrol  200 mg Oral Daily  . mupirocin ointment  1 application Nasal BID  . Warfarin - Pharmacist Dosing Inpatient   Does not apply q1800   Continuous Infusions: . sodium  chloride 50 mL/hr at 06/21/12 0848  . lactated ringers 50 mL/hr at 06/19/12 1008       Time spent: 25 minutes    Alexander Lyons  [facility] Pager  [phone]. If 7PM-7AM, please contact night-coverage at [email or web address], password [facility] 06/21/2012, 10:45 AM  LOS: 3 days

## 2012-06-21 NOTE — Progress Notes (Signed)
ANTICOAGULATION CONSULT NOTE - Initial Consult  Pharmacy Consult for Coumadin Indication: atrial fibrillation and VTE prophylaxis s/p L hip hemiarthroplasty  No Known Allergies  Patient Measurements:   Ht ~ 72 inches Wt ~ 53.4 kg  Vital Signs: Temp: 97.3 F (36.3 C) (04/12 0536) BP: 168/80 mmHg (04/12 0729) Pulse Rate: 89 (04/12 0536)  Labs:  Recent Labs  06/19/12 0630 06/20/12 0425 06/21/12 0648  HGB 11.3* 10.8* 10.2*  HCT 32.5* 30.7* 29.5*  PLT 341 381 390  LABPROT 18.5* 27.9* 35.1*  INR 1.59* 2.77* 3.78*  CREATININE 1.03 1.14 0.96   CrCl ~ 45 ml/hr  Medical History: Past Medical History  Diagnosis Date  . HTN (hypertension)   . Dementia   . History of CVA (cerebrovascular accident)     no residual deficit (this apparently occured shortly after coming off of aspirin for cataract surgery.  Of note, old records show MRI head w/out contrast 07/2010 showed chronic small vessel infarctions w/in the pons + chronic small vessel dz throughout the brain.  . Atrial fibrillation   . Hearing impairment in [facility]    Bilateral; [facility] but he won't wear them  . Colon polyp 2002  . History of dizziness   . Elevated PSA 2012    PSA 28.13 Apr 2010: Urology eval (Dr. Cleone Slim)  . SCC (squamous cell carcinoma), leg 2010    left  . History of rib fracture 2006    4 on right; nondisplaced (s/p lawn mower fell on him)  . Vertebral compression fracture     30% anterior wedge compression deformity T11    Medications:  Prescriptions prior to admission  Medication Sig Dispense Refill  . digoxin (LANOXIN) 0.125 MG tablet Take 0.125 mg by mouth daily.       Marland Kitchen donepezil (ARICEPT) 10 MG tablet Take 10 mg by mouth at bedtime.      . megestrol (MEGACE) 40 MG/ML suspension Take 200 mg by mouth daily.      . [DISCONTINUED] lisinopril-hydrochlorothiazide (PRINZIDE,ZESTORETIC) 20-12.5 MG per tablet Take 1 tablet by mouth daily.      . [DISCONTINUED] warfarin (COUMADIN) 2.5 MG  tablet Take 1.25-2.5 mg by mouth daily. Takes 2.5 mg on Monday and Thursday. Takes 1.25 mg all other days.        Assessment: 79 yo M on Coumadin PTA for hx afib.  Pt fell PTA and presented for L hip surgery (4/10).  INR was reversed with 10 mg IV Vitamin K (4/9). Coumadin was resumed after surgery on 4/10, but INR climbing up quickly after only one dose of coumadin. INR 3.78 this morning. Patient probably will require much lower dose given poor PO intake recently. Likely will d/c to SNF today.  Home Coumadin dose = 1.25 mg daily except 2.5mg  Mon and Thurs.   Goal of Therapy:  INR 2-3 Monitor platelets by anticoagulation protocol: Yes   Plan:  Hold coumadin today Informed MD to hold coumadin today and tomorrow if discharge and restart coumadin at lower dose after INR check.   Bayard Hugger, PharmD, BCPS  Clinical Pharmacist  Pager: [phone]  06/21/2012 11:05 AM

## 2012-06-26 ENCOUNTER — Ambulatory Visit: Payer: Medicare Other | Admitting: Family Medicine

## 2013-05-18 ENCOUNTER — Telehealth: Payer: Self-pay | Admitting: Family Medicine

## 2013-05-18 NOTE — Telephone Encounter (Signed)
Patient passed away this morning.

## 2013-06-10 DEATH — deceased

## 2014-06-18 IMAGING — CR DG CHEST 2V
2 series · 2 of 2 positions shown · non-contrast
Comparison: None.

CLINICAL DATA: Altered mental status.

CHEST - 2 VIEW

[w chest pa]
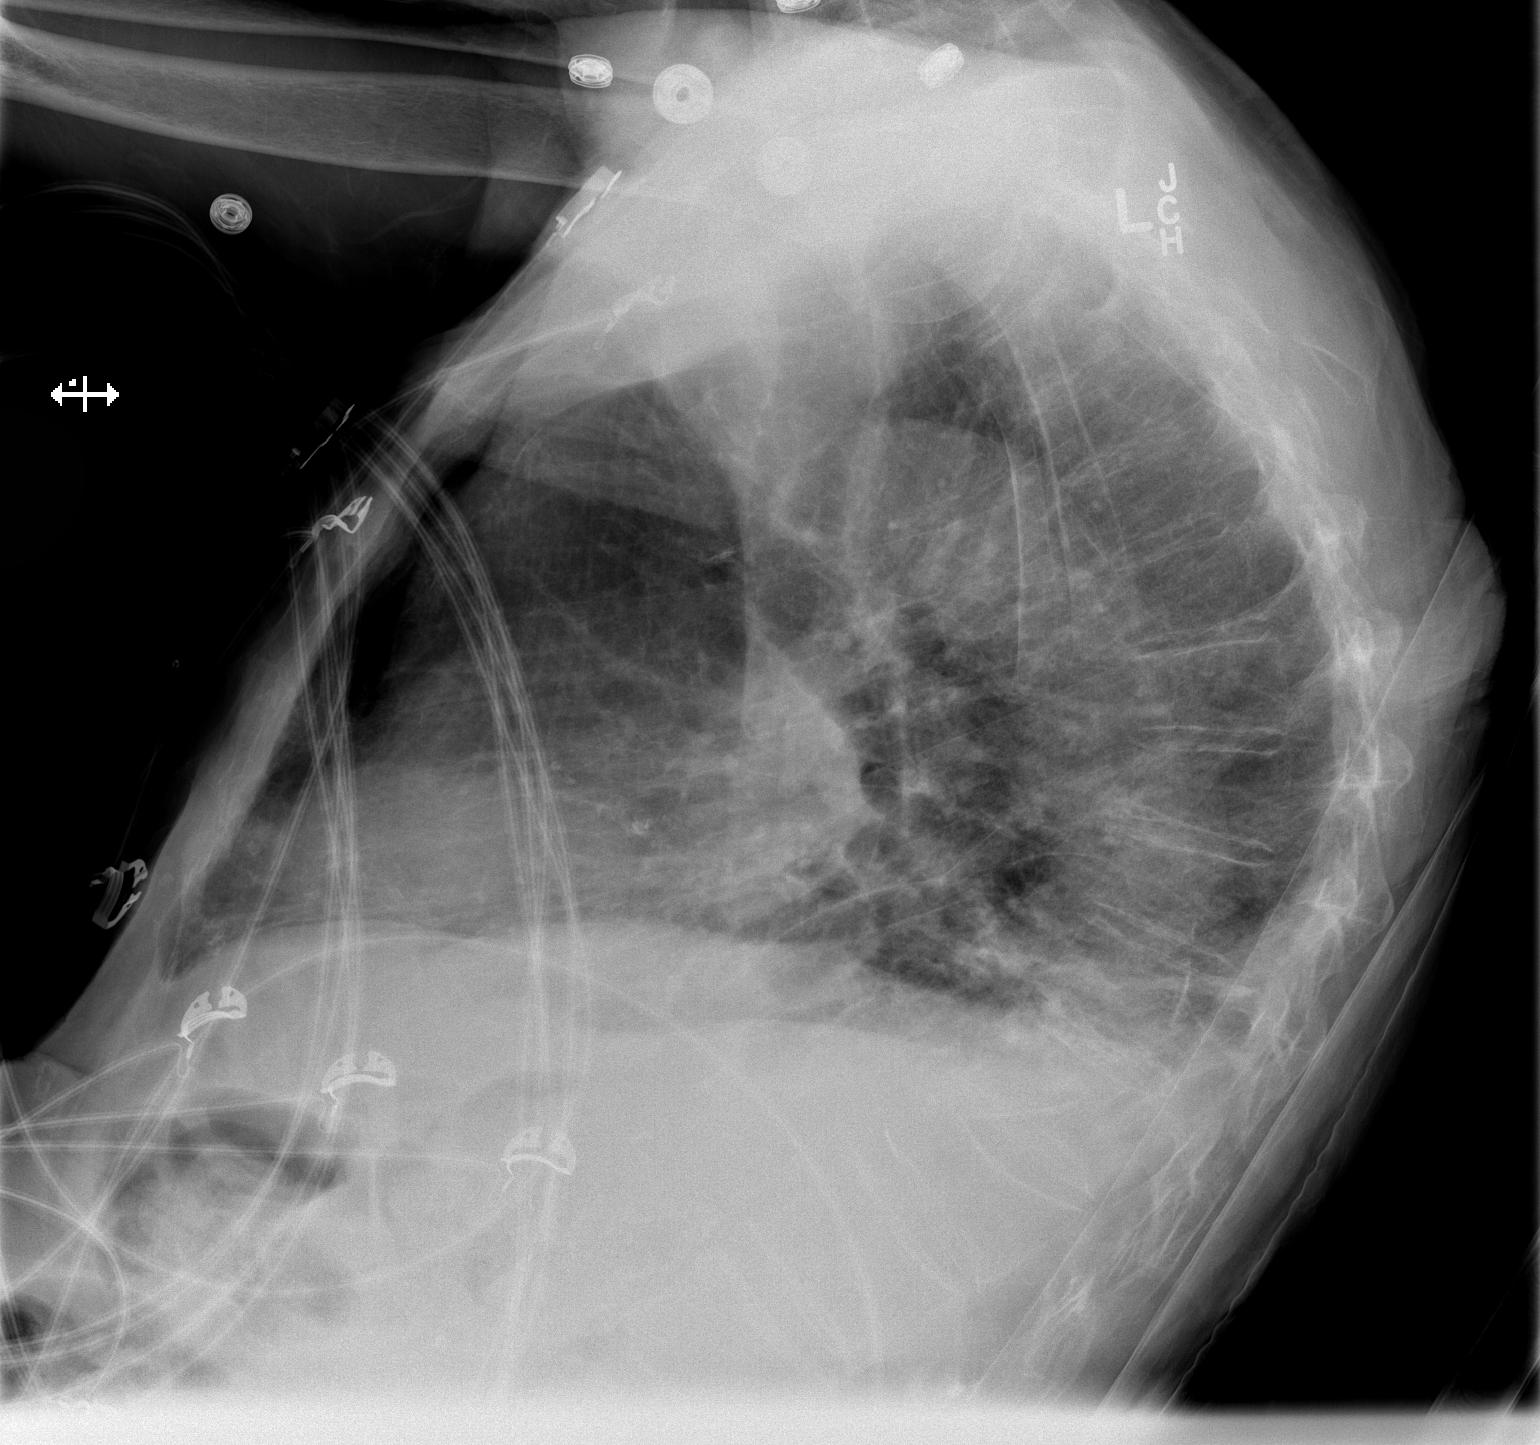

[x chest ap]
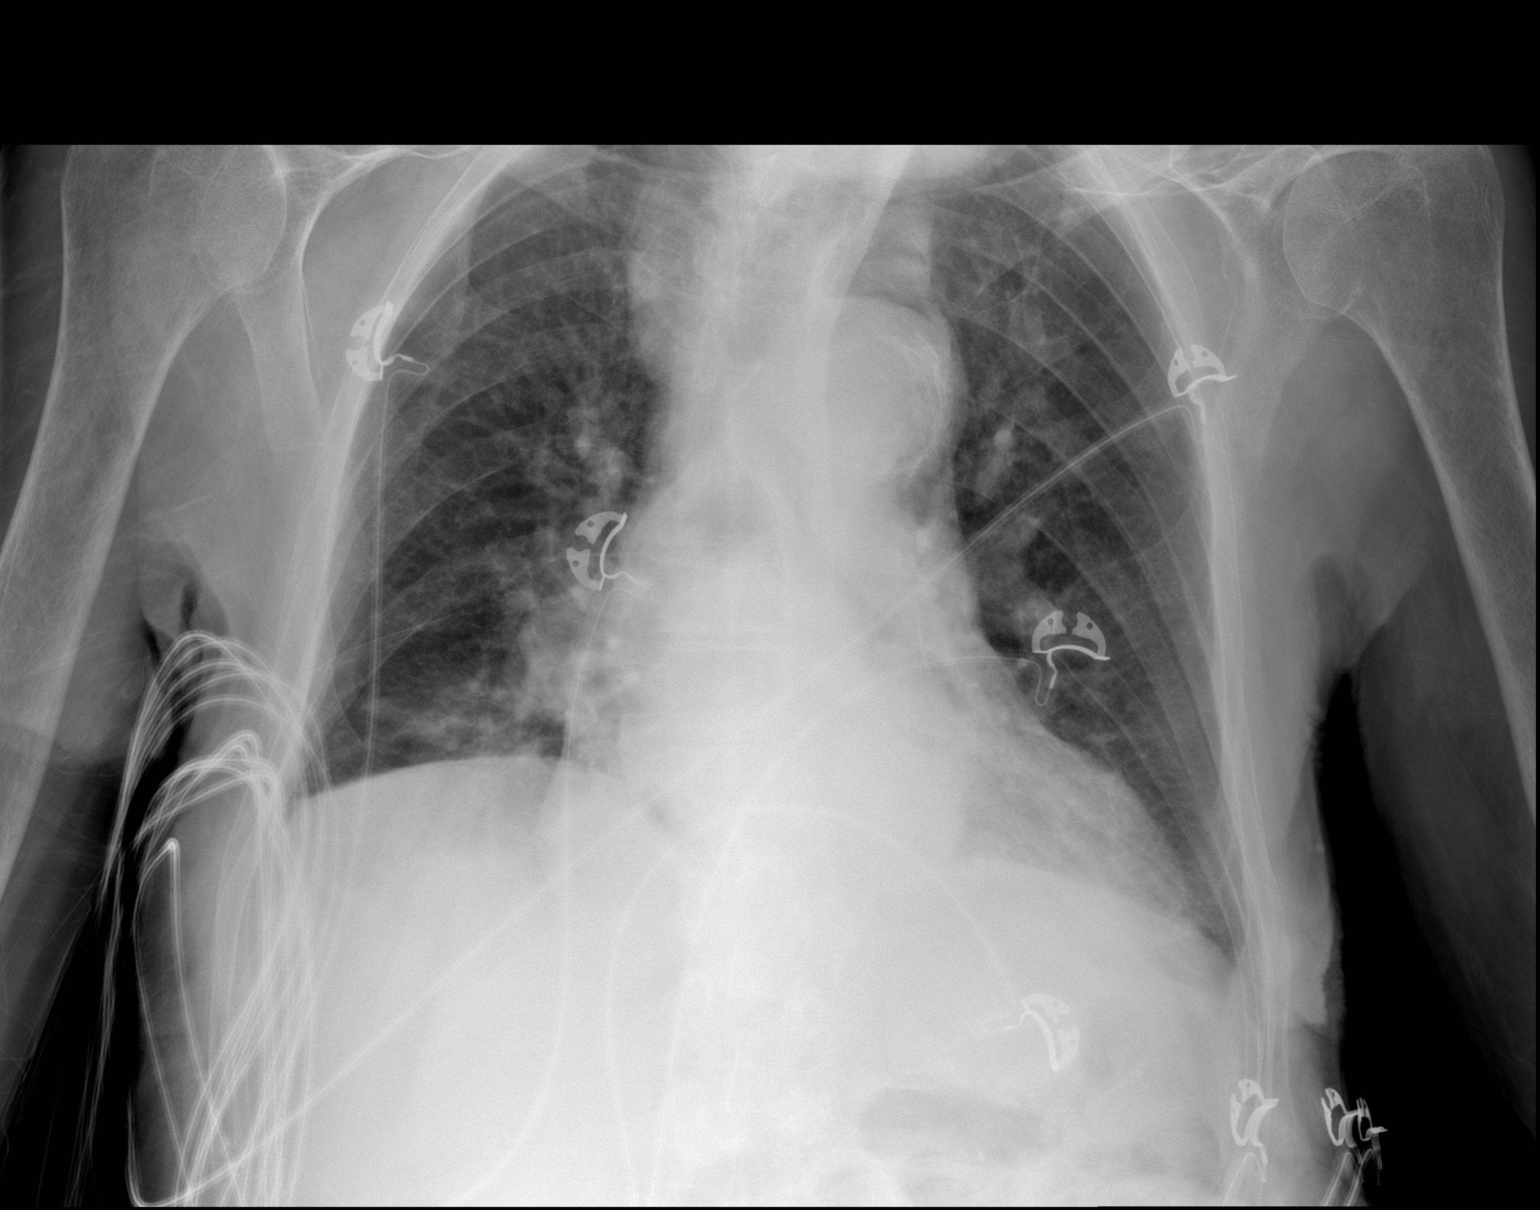

[2 of 2 positions shown; findings below may reference images not displayed]

FINDINGS: The heart is enlarged.  There is a hiatal hernia.  Mildly
prominent interstitial markings are identified.  There is probable
mild interstitial edema.  Aorta is tortuous and partially
calcified.  Small bilateral pleural effusions are present.  Note is
made of a wedge compression fracture in the lower thoracic spine,
likely T12.  The age is indeterminate.  There is mild wedging of
other thoracic levels, the chronicity of which is not known.
IMPRESSION: 1.  Cardiomegaly and mild interstitial edema.
2.  Bilateral small pleural effusions.
3.  Wedge compression fracture of T12, age indeterminate.

## 2014-06-18 IMAGING — CT CT HEAD W/O CM
1 of 2 series · 13 of 30 positions shown, 17 images · non-contrast
Comparison: None.

CLINICAL DATA: Decreased oral intake for 4 days.  No other
complaints.

CT HEAD WITHOUT CONTRAST
TECHNIQUE: Contiguous axial images were obtained from the base of
the skull through the vertex without contrast.

[Series 2: brain · axial · 0.47mm/px · z∈[+74,+213]mm · 13 of 31 slices shown, 17 images]
[im 3/31  brain]
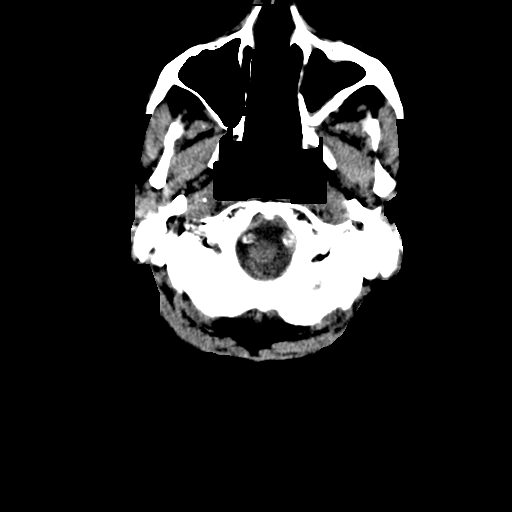
[im 3/31  bone]
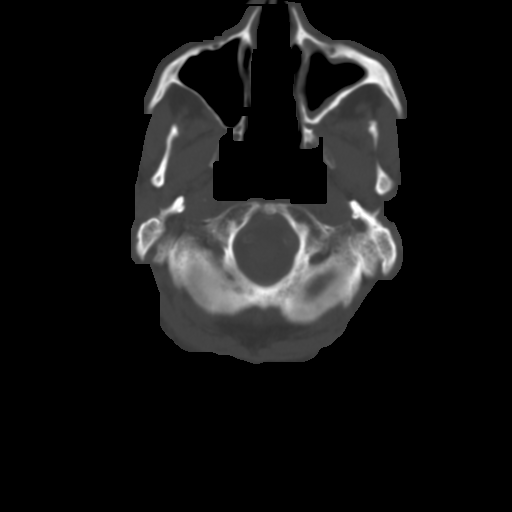
[im 5/31  brain]
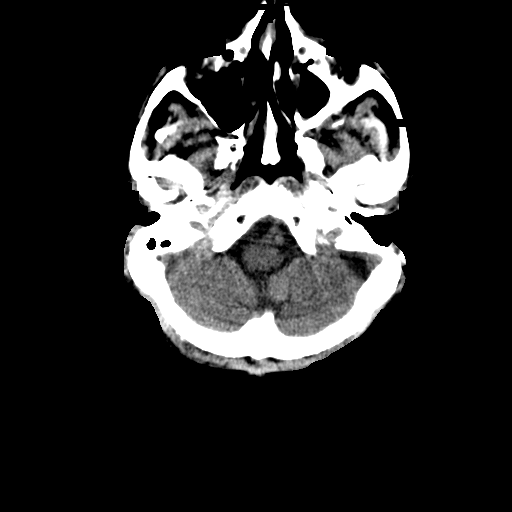
[im 7/31  brain]
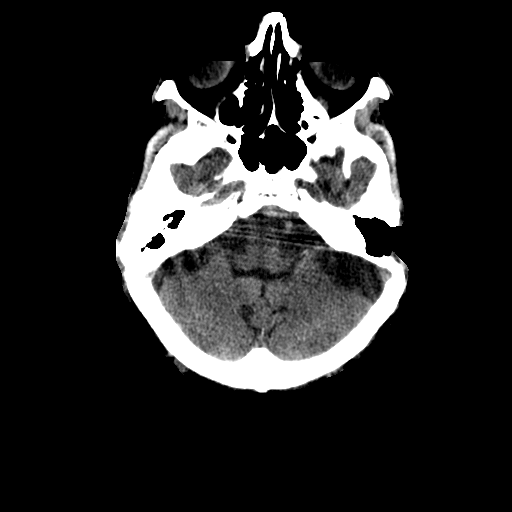
[im 9/31  brain]
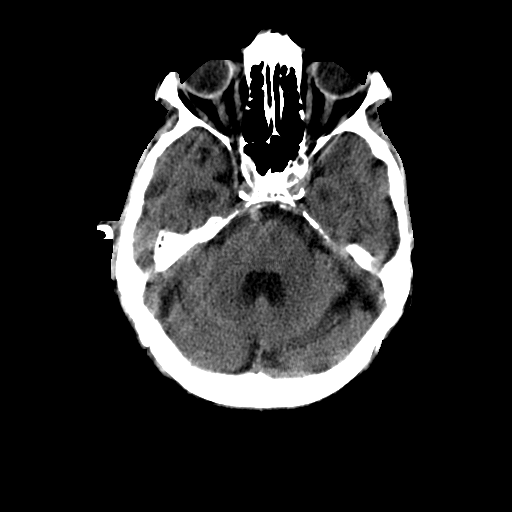
[im 11/31  brain]
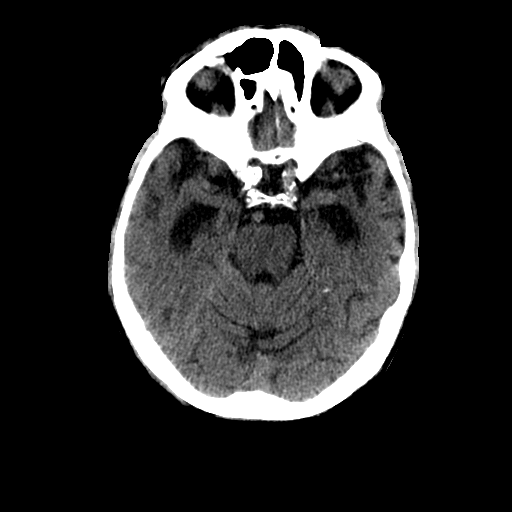
[im 11/31  bone]
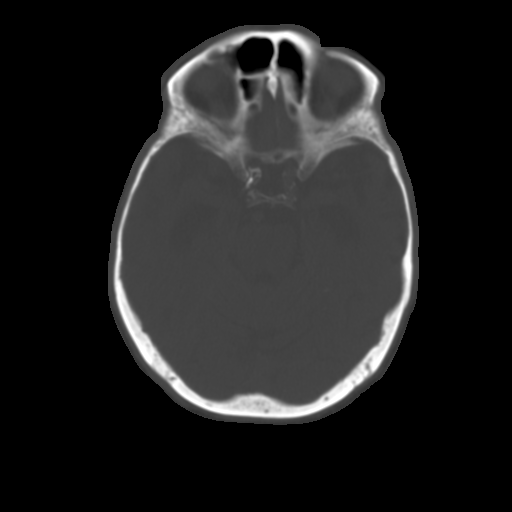
[im 13/31  brain]
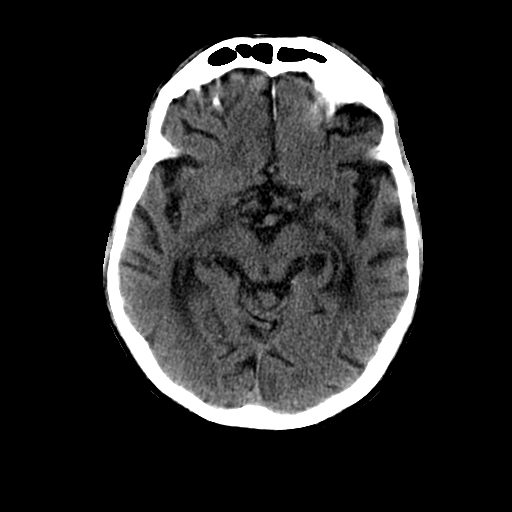
[im 16/31  brain]
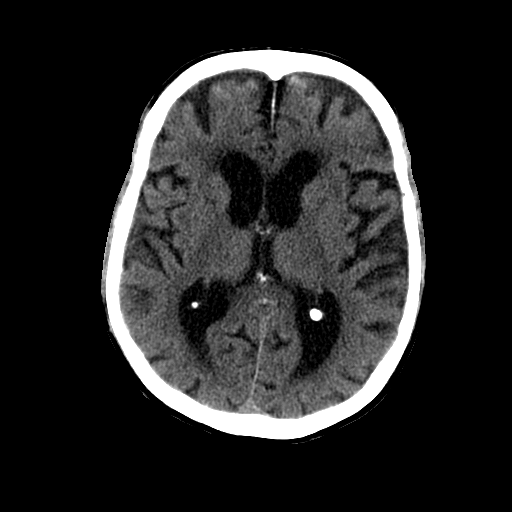
[im 18/31  brain]
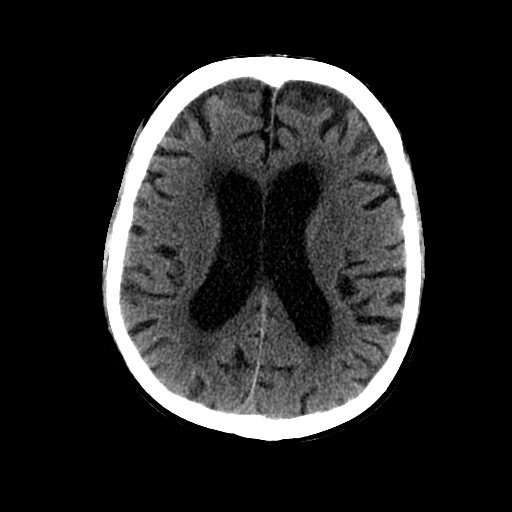
[im 20/31  brain]
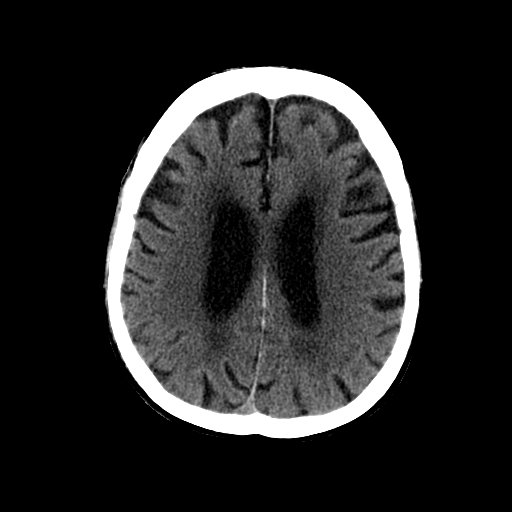
[im 20/31  bone]
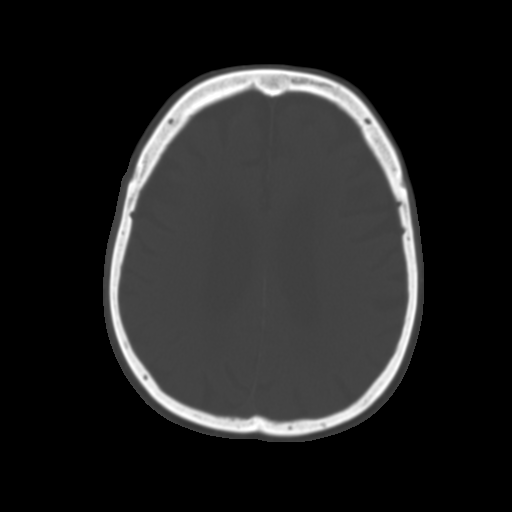
[im 22/31  brain]
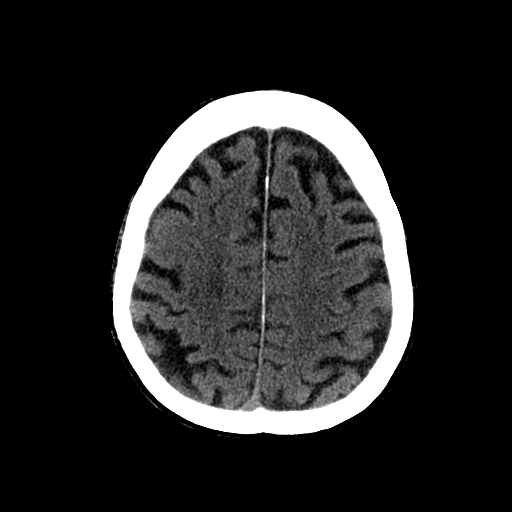
[im 24/31  brain]
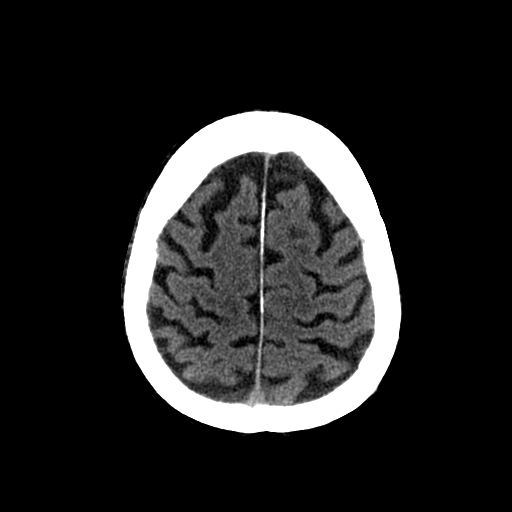
[im 26/31  brain]
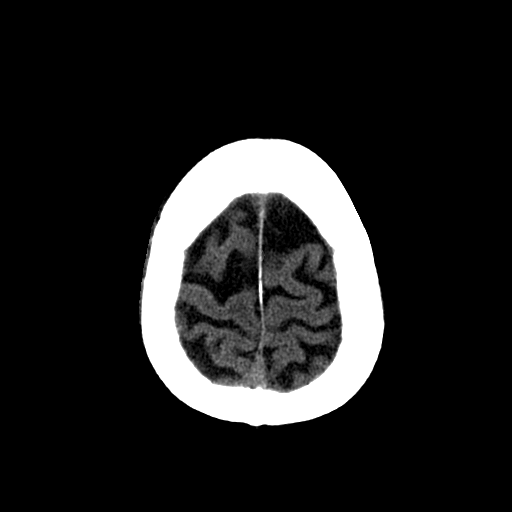
[im 28/31  brain]
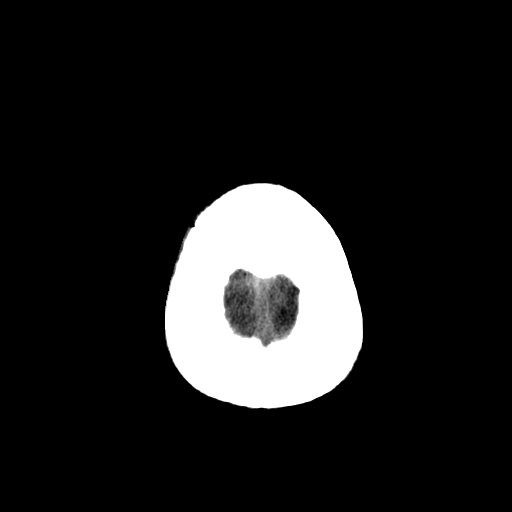
[im 28/31  bone]
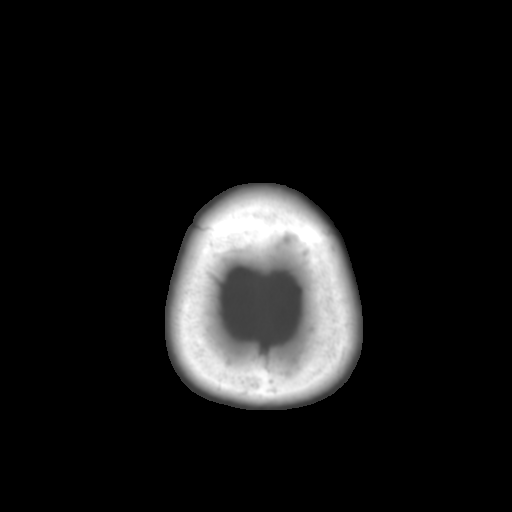

[13 of 30 positions shown; findings below may reference images not displayed]

FINDINGS: There is significant central cortical atrophy.
Periventricular white matter changes are consistent with small
vessel disease. There is no evidence for hemorrhage, mass lesion,
or acute infarction.

Bone windows show no calvarial fracture.  There is atherosclerotic
calcification of the internal carotid arteries.  Mild
mucoperiosteal thickening of the maxillary and ethmoid air cells.
IMPRESSION: 1.  Atrophy and small vessel disease.
2. No evidence for acute intracranial abnormality.
3.  Chronic sinusitis.
